# Patient Record
Sex: Female | Born: 1990 | Race: Black or African American | Hispanic: No | Marital: Single | Smoking: Never smoker
Health system: Southern US, Community
[De-identification: ages and names within clinical notes are randomized; demographics above are authoritative.]

## PROBLEM LIST (undated history)

## (undated) DIAGNOSIS — A64 Unspecified sexually transmitted disease: Secondary | ICD-10-CM

## (undated) HISTORY — PX: DILATION AND CURETTAGE OF UTERUS: SHX78

## (undated) HISTORY — DX: Unspecified sexually transmitted disease: A64

---

## 2016-09-01 NOTE — L&D Delivery Note (Addendum)
Operative Delivery Note At 2:02 AM a viable and healthy female was delivered via , Forcep Assisted.  Presentation: vertex; Position: Occiput,, Posterior; Station: +3.  Verbal consent: obtained from patient.  Risks and benefits discussed in detail.  Risks include, but are not limited to the risks of anesthesia, bleeding, infection, damage to maternal tissues, fetal cephalhematoma.  There is also the risk of inability to effect vaginal delivery of the head, or shoulder dystocia that cannot be resolved by established maneuvers, leading to the need for emergency cesarean section.  APGAR: 5, 6; weight  .   Placenta status: , .   Cord:  with the following complications: .  Cord pH: 7.17, PCO2 in record.  Anesthesia:  epidural Instruments: Mityvac II bell. Episiotomy: None Lacerations:  Deep second degree withsulcus tearing bilateral Suture Repair: 2.0 vicryl rapide and 3-0 monocryl Est. Blood Loss (mL):  450  Mom to postpartum.  Baby to Couplet care / Skin to Skin. Pt given informed consent discussion, and agreed with recommended vacuum assist. Catheter removed from bladder . Assistance requested from del team. Peds notified.  With the first contraction, the bell popped off after 1 push due to poor seal. POsition of the bell had been confirmed as posterior to the anterior fontanel which was identified. On the second push the baby came down nicely, and spontaneously rotated clockwise to ROA and bell was removed. With the next contraction, third since start of vacuum effort, the pt delivered the head , the shoulders could be rotated such that the right arm was anterior and released easily, and baby expelled by Mom over a second degree laceration, Baby passed to peds at 30 secs with fhr in 80-100 range. Significant moulding of the vertex noted. Cord blood gas obtained and  Routine labs. No malodor noted. Placenta expelled by mom in one push. ebl 450 Repair of anterior sphincter capsule with a single  figure of 8. Of 2-0 vicryl rapide. Perineal body stitches x 2 also Remainder of repair with continuous running 3-0 monocryl. Good repair accomplished  Tilda BurrowJohn V Jarman Litton 07/26/2017, 3:08 AM

## 2016-12-30 ENCOUNTER — Ambulatory Visit (INDEPENDENT_AMBULATORY_CARE_PROVIDER_SITE_OTHER): Payer: Federal, State, Local not specified - PPO | Admitting: *Deleted

## 2016-12-30 DIAGNOSIS — N912 Amenorrhea, unspecified: Secondary | ICD-10-CM

## 2016-12-30 DIAGNOSIS — Z3201 Encounter for pregnancy test, result positive: Secondary | ICD-10-CM | POA: Diagnosis not present

## 2016-12-30 DIAGNOSIS — Z3400 Encounter for supervision of normal first pregnancy, unspecified trimester: Secondary | ICD-10-CM

## 2016-12-30 LAB — POCT URINE PREGNANCY: Preg Test, Ur: POSITIVE — AB

## 2016-12-30 MED ORDER — PRENATAL PLUS DHA 7-0.4-100 MG PO TABS: 1.0000 | ORAL_TABLET | Freq: Every day | ORAL | 11 refills | Status: DC

## 2016-12-30 NOTE — Progress Notes (Signed)
Patient had positive home test. LMP 10/25/2016 [redacted]w[redacted]d EDD 08/01/17 Patient to make NOB appointment.

## 2017-01-16 ENCOUNTER — Encounter: Payer: Federal, State, Local not specified - PPO | Admitting: Obstetrics

## 2017-02-05 ENCOUNTER — Ambulatory Visit (INDEPENDENT_AMBULATORY_CARE_PROVIDER_SITE_OTHER): Payer: Federal, State, Local not specified - PPO | Admitting: Obstetrics

## 2017-02-05 ENCOUNTER — Encounter: Payer: Self-pay | Admitting: Obstetrics

## 2017-02-05 VITALS — BP 118/67 | HR 81 | Ht 64.0 in | Wt 163.5 lb

## 2017-02-05 DIAGNOSIS — N898 Other specified noninflammatory disorders of vagina: Secondary | ICD-10-CM

## 2017-02-05 DIAGNOSIS — Z113 Encounter for screening for infections with a predominantly sexual mode of transmission: Secondary | ICD-10-CM | POA: Diagnosis not present

## 2017-02-05 DIAGNOSIS — Z3687 Encounter for antenatal screening for uncertain dates: Secondary | ICD-10-CM

## 2017-02-05 DIAGNOSIS — Z124 Encounter for screening for malignant neoplasm of cervix: Secondary | ICD-10-CM

## 2017-02-05 DIAGNOSIS — Z3490 Encounter for supervision of normal pregnancy, unspecified, unspecified trimester: Secondary | ICD-10-CM

## 2017-02-05 DIAGNOSIS — Z3492 Encounter for supervision of normal pregnancy, unspecified, second trimester: Secondary | ICD-10-CM

## 2017-02-05 NOTE — Progress Notes (Signed)
Subjective:    Wanda Gillespie is being seen today for her first obstetrical visit.  This is not a planned pregnancy. She is at 2889w5d gestation. Her obstetrical history is significant for none. Relationship with FOB: significant other, living together. Patient does intend to breast feed. Pregnancy history fully reviewed.  The information documented in the HPI was reviewed and verified.  Menstrual History: OB History    Gravida Para Term Preterm AB Living   1             SAB TAB Ectopic Multiple Live Births                   Patient's last menstrual period was 10/25/2016.    History reviewed. No pertinent past medical history.  Past Surgical History:  Procedure Laterality Date  . DILATION AND CURETTAGE OF UTERUS       (Not in a hospital admission) Not on File  Social History  Substance Use Topics  . Smoking status: Never Smoker  . Smokeless tobacco: Never Used  . Alcohol use No    Family History  Problem Relation Age of Onset  . Heart disease Maternal Grandmother   . Heart disease Paternal Grandmother      Review of Systems Constitutional: negative for weight loss Gastrointestinal: negative for vomiting Genitourinary:negative for genital lesions and vaginal discharge and dysuria Musculoskeletal:negative for back pain Behavioral/Psych: negative for abusive relationship, depression, illegal drug usage and tobacco use    Objective:    BP 118/67   Pulse 81   Ht 5\' 4"  (1.626 m)   Wt 163 lb 8 oz (74.2 kg)   LMP 10/25/2016   BMI 28.06 kg/m  General Appearance:    Alert, cooperative, no distress, appears stated age  Head:    Normocephalic, without obvious abnormality, atraumatic  Eyes:    PERRL, conjunctiva/corneas clear, EOM's intact, fundi    benign, both eyes  Ears:    Normal TM's and external ear canals, both ears  Nose:   Nares normal, septum midline, mucosa normal, no drainage    or sinus tenderness  Throat:   Lips, mucosa, and tongue normal; teeth and gums  normal  Neck:   Supple, symmetrical, trachea midline, no adenopathy;    thyroid:  no enlargement/tenderness/nodules; no carotid   bruit or JVD  Back:     Symmetric, no curvature, ROM normal, no CVA tenderness  Lungs:     Clear to auscultation bilaterally, respirations unlabored  Chest Wall:    No tenderness or deformity   Heart:    Regular rate and rhythm, S1 and S2 normal, no murmur, rub   or gallop  Breast Exam:    No tenderness, masses, or nipple abnormality  Abdomen:     Soft, non-tender, bowel sounds active all four quadrants,    no masses, no organomegaly  Genitalia:    Normal female without lesion, discharge or tenderness  Extremities:   Extremities normal, atraumatic, no cyanosis or edema  Pulses:   2+ and symmetric all extremities  Skin:   Skin color, texture, turgor normal, no rashes or lesions  Lymph nodes:   Cervical, supraclavicular, and axillary nodes normal  Neurologic:   CNII-XII intact, normal strength, sensation and reflexes    throughout      Lab Review Urine pregnancy test Labs reviewed yes Radiologic studies reviewed no  Assessment:    Pregnancy at 1189w5d weeks    Plan:     1. Encounter for supervision of normal pregnancy, antepartum,  unspecified gravidity Rx: - Culture, OB Urine - Cystic Fibrosis Mutation 97 - Hemoglobinopathy evaluation - Obstetric Panel, Including HIV - Varicella zoster antibody, IgG - VITAMIN D 25 Hydroxy (Vit-D Deficiency, Fractures) - Cytology - PAP - [place] MFM OB COMP + 14 WK; Future  2. Vaginal discharge Rx: - Cervicovaginal ancillary only  3. Unsure of LMP (last menstrual period) as reason for ultrasound scan - Ultrasound ordered for dating  Prenatal vitamins.  Counseling provided regarding continued use of seat belts, cessation of alcohol consumption, smoking or use of illicit drugs; infection precautions i.e., influenza/TDAP immunizations, toxoplasmosis,CMV, parvovirus, listeria and varicella; workplace safety, exercise  during pregnancy; routine dental care, safe medications, sexual activity, hot tubs, saunas, pools, travel, caffeine use, fish and methlymercury, potential toxins, hair treatments, varicose veins Weight gain recommendations per IOM guidelines reviewed: underweight/BMI< 18.5--> gain 28 - 40 lbs; normal weight/BMI 18.5 - 24.9--> gain 25 - 35 lbs; overweight/BMI 25 - 29.9--> gain 15 - 25 lbs; obese/BMI >30->gain  11 - 20 lbs Problem list reviewed and updated. FIRST/CF mutation testing/NIPT/QUAD SCREEN/fragile X/Ashkenazi Jewish population testing/Spinal muscular atrophy discussed: requested. Role of ultrasound in pregnancy discussed; fetal survey: requested. Amniocentesis discussed: not indicated.  No orders of the defined types were placed in this encounter.  Orders Placed This Encounter  Procedures  . Culture, OB Urine  . [place] MFM OB COMP + 14 WK    Standing Status:   Future    Standing Expiration Date:   04/07/2018    Order Specific Question:   Reason for Exam (SYMPTOM  OR DIAGNOSIS REQUIRED)    Answer:   Unsure LMP    Order Specific Question:   Preferred imaging location?    Answer:   [facility]  . Cystic Fibrosis Mutation 97  . Hemoglobinopathy evaluation  . Obstetric Panel, Including HIV  . Varicella zoster antibody, IgG  . VITAMIN D 25 Hydroxy (Vit-D Deficiency, Fractures)    Follow up in 2 weeks. 50% of 20 min visit spent on counseling and coordination of care. Patient ID: Wanda Gillespie, female   DOB: 08-Nov-1990, 26 y.o.   MRN: [id number]

## 2017-02-05 NOTE — Addendum Note (Signed)
Addended by: Hamilton CapriBURCH, Molley Houser J on: 02/05/2017 04:03 PM   Modules accepted: Orders

## 2017-02-06 LAB — CERVICOVAGINAL ANCILLARY ONLY
BACTERIAL VAGINITIS: NEGATIVE
CHLAMYDIA, DNA PROBE: NEGATIVE
Candida vaginitis: POSITIVE — AB
Neisseria Gonorrhea: NEGATIVE
Trichomonas: NEGATIVE

## 2017-02-07 ENCOUNTER — Other Ambulatory Visit: Payer: Self-pay | Admitting: Obstetrics

## 2017-02-07 DIAGNOSIS — B3731 Acute candidiasis of vulva and vagina: Secondary | ICD-10-CM

## 2017-02-07 DIAGNOSIS — B373 Candidiasis of vulva and vagina: Secondary | ICD-10-CM

## 2017-02-07 MED ORDER — TERCONAZOLE 0.4 % VA CREA: 1.0000 | TOPICAL_CREAM | Freq: Every day | VAGINAL | 2 refills | Status: DC

## 2017-02-09 LAB — OBSTETRIC PANEL, INCLUDING HIV
Antibody Screen: NEGATIVE
BASOS ABS: 0 10*3/uL (ref 0.0–0.2)
Basos: 0 %
EOS (ABSOLUTE): 0.1 10*3/uL (ref 0.0–0.4)
Eos: 1 %
HEMATOCRIT: 34.7 % (ref 34.0–46.6)
HEP B S AG: NEGATIVE
HIV Screen 4th Generation wRfx: NONREACTIVE
Hemoglobin: 12 g/dL (ref 11.1–15.9)
IMMATURE GRANS (ABS): 0 10*3/uL (ref 0.0–0.1)
Immature Granulocytes: 0 %
LYMPHS: 25 %
Lymphocytes Absolute: 1.7 10*3/uL (ref 0.7–3.1)
MCH: 29.3 pg (ref 26.6–33.0)
MCHC: 34.6 g/dL (ref 31.5–35.7)
MCV: 85 fL (ref 79–97)
MONOCYTES: 7 %
Monocytes Absolute: 0.5 10*3/uL (ref 0.1–0.9)
Neutrophils Absolute: 4.5 10*3/uL (ref 1.4–7.0)
Neutrophils: 67 %
PLATELETS: 163 10*3/uL (ref 150–379)
RBC: 4.1 x10E6/uL (ref 3.77–5.28)
RDW: 13.8 % (ref 12.3–15.4)
RPR: NONREACTIVE
RUBELLA: 1.48 {index} (ref >0.99)
Rh Factor: POSITIVE
WBC: 6.8 10*3/uL (ref 3.4–10.8)

## 2017-02-09 LAB — HEMOGLOBINOPATHY EVALUATION
HGB C: 0 %
HGB S: 0 %
HGB VARIANT: 0 %
Hemoglobin A2 Quantitation: 2.4 % (ref 1.8–3.2)
Hemoglobin F Quantitation: 0 % (ref 0.0–2.0)
Hgb A: 97.6 % (ref 96.4–98.8)

## 2017-02-09 LAB — VARICELLA ZOSTER ANTIBODY, IGG

## 2017-02-09 LAB — VITAMIN D 25 HYDROXY (VIT D DEFICIENCY, FRACTURES): VIT D 25 HYDROXY: 26.3 ng/mL — AB (ref 30.0–100.0)

## 2017-02-10 NOTE — Addendum Note (Signed)
Addended by: Reva BoresPRATT, TANYA S on: 02/10/2017 09:48 AM   Modules accepted: Kipp BroodSmartSet

## 2017-02-11 LAB — URINE CULTURE, OB REFLEX

## 2017-02-11 LAB — CULTURE, OB URINE

## 2017-02-12 ENCOUNTER — Other Ambulatory Visit: Payer: Self-pay | Admitting: Obstetrics

## 2017-02-12 DIAGNOSIS — O09899 Supervision of other high risk pregnancies, unspecified trimester: Secondary | ICD-10-CM

## 2017-02-12 DIAGNOSIS — Z87440 Personal history of urinary (tract) infections: Principal | ICD-10-CM

## 2017-02-12 MED ORDER — AMOXICILLIN-POT CLAVULANATE 875-125 MG PO TABS: 1.0000 | ORAL_TABLET | Freq: Two times a day (BID) | ORAL | 0 refills | Status: DC

## 2017-02-13 ENCOUNTER — Other Ambulatory Visit: Payer: Self-pay | Admitting: Obstetrics

## 2017-02-13 LAB — CYTOLOGY - PAP: Diagnosis: NEGATIVE

## 2017-03-09 ENCOUNTER — Ambulatory Visit (HOSPITAL_COMMUNITY)
Admission: RE | Admit: 2017-03-09 | Discharge: 2017-03-09 | Disposition: A | Discharge status: Home / Self Care | Payer: Federal, State, Local not specified - PPO | Source: Ambulatory Visit | Attending: Obstetrics | Admitting: Obstetrics

## 2017-03-09 ENCOUNTER — Other Ambulatory Visit: Payer: Self-pay | Admitting: Obstetrics

## 2017-03-09 DIAGNOSIS — Z3490 Encounter for supervision of normal pregnancy, unspecified, unspecified trimester: Secondary | ICD-10-CM

## 2017-03-09 DIAGNOSIS — Z3A20 20 weeks gestation of pregnancy: Secondary | ICD-10-CM

## 2017-03-10 ENCOUNTER — Ambulatory Visit (INDEPENDENT_AMBULATORY_CARE_PROVIDER_SITE_OTHER): Payer: Federal, State, Local not specified - PPO | Admitting: Obstetrics & Gynecology

## 2017-03-10 VITALS — BP 114/71 | HR 78 | Wt 171.4 lb

## 2017-03-10 DIAGNOSIS — Z3402 Encounter for supervision of normal first pregnancy, second trimester: Secondary | ICD-10-CM

## 2017-03-10 DIAGNOSIS — IMO0002 Reserved for concepts with insufficient information to code with codable children: Secondary | ICD-10-CM

## 2017-03-10 DIAGNOSIS — Z0489 Encounter for examination and observation for other specified reasons: Secondary | ICD-10-CM

## 2017-03-10 DIAGNOSIS — Z3400 Encounter for supervision of normal first pregnancy, unspecified trimester: Secondary | ICD-10-CM

## 2017-03-10 NOTE — Progress Notes (Signed)
PRENATAL VISIT NOTE  Subjective:  Wanda Gillespie is a 26 y.o. G1P0 at [redacted]w[redacted]d being seen today for ongoing prenatal care.  She is currently monitored for the following issues for this low-risk pregnancy and has Encounter for supervision of normal pregnancy, unspecified, unspecified trimester on her problem list.  Patient reports no complaints.  Contractions: Not present. Vag. Bleeding: None.  Movement: Present. Denies leaking of fluid.   The following portions of the patient's history were reviewed and updated as appropriate: allergies, current medications, past family history, past medical history, past social history, past surgical history and problem list. Problem list updated.  Objective:   Vitals:   03/10/17 1059  BP: 114/71  Pulse: 78  Weight: 171 lb 6.4 oz (77.7 kg)    Fetal Status: Fetal Heart Rate (bpm): 145   Movement: Present     General:  Alert, oriented and cooperative. Patient is in no acute distress.  Skin: Skin is warm and dry. No rash noted.   Cardiovascular: Normal heart rate noted  Respiratory: Normal respiratory effort, no problems with respiration noted  Abdomen: Soft, gravid, appropriate for gestational age. Pain/Pressure: Absent     Pelvic:  Cervical exam deferred        Extremities: Normal range of motion.  Edema: None  Mental Status: Normal mood and affect. Normal behavior. Normal judgment and thought content.  [place] Mfm Ob Comp + 14 Wk  Result Date: 03/09/2017 ----------------------------------------------------------------------  OBSTETRICS REPORT                      (Signed Final 03/09/2017 05:43 pm) ---------------------------------------------------------------------- Patient Info  ID #:       [id number]                         D.O.B.:   1990/12/02 (25 yrs)  Name:       Wanda Gillespie                  Visit Date:  03/09/2017 12:54 pm ---------------------------------------------------------------------- Performed By  Performed By:     Emeline Darling BS,      Ref.  Address:     [address]                                                             [place], [place]                                                             [id number]  Attending:        Particia Nearing MD  [facility]  Referred By:      Brock Bad MD ---------------------------------------------------------------------- Orders   #  Description                                 Code   1  [place] MFM OB COMP + 14 WK                      76805.01  ----------------------------------------------------------------------   #  Ordered By               Order #        Accession #    Episode #   1  Coral Ceo           [id number]      [id number]     [id number]  ---------------------------------------------------------------------- Indications   [redacted] weeks gestation of pregnancy                Z3A.20   Encounter for antenatal screening for          Z36.3   malformations   Encounter for uncertain dates                  Z36.87  ---------------------------------------------------------------------- OB History  Blood Type:            Height:  5'4"   Weight (lb):  163      BMI:   27.98  Gravidity:    1 ---------------------------------------------------------------------- Fetal Evaluation  Num Of Fetuses:     1  Fetal Heart         151  Rate(bpm):  Cardiac Activity:   Observed  Presentation:       Cephalic  Placenta:           Anterior, above cervical os  P. Cord Insertion:  Visualized  Amniotic Fluid  AFI FV:      Subjectively within normal limits                              Largest Pocket(cm)                              4.6 ---------------------------------------------------------------------- Biometry  BPD:        47  mm     G. Age:  20w 1d         40  %    CI:        73.34   %   70 - 86                                                          FL/HC:      20.2   %   16.8 - 19.8  HC:       174.4  mm     G. Age:  20w 0d         23  %    HC/AC:      1.17  1.09 - 1.39  AC:      148.5  mm     G. Age:  20w 1d         34  %    FL/BPD:     74.9   %  FL:       35.2  mm     G. Age:  21w 1d         66  %    FL/AC:      23.7   %   20 - 24  NFT:       3.3  mm  Est. FW:     358  gm    0 lb 13 oz      49  % ---------------------------------------------------------------------- Gestational Age  LMP:           19w 2d       Date:   10/25/16                 EDD:   08/01/17  U/S Today:     20w 3d                                        EDD:   07/24/17  Best:          Cherylann Parr 3d    Det. By:   U/S (03/09/17)           EDD:   07/24/17 ---------------------------------------------------------------------- Anatomy  Cranium:               Appears normal         Aortic Arch:            Appears normal  Cavum:                 Appears normal         Ductal Arch:            Appears normal  Ventricles:            Appears normal         Diaphragm:              Appears normal  Choroid Plexus:        Appears normal         Stomach:                Appears normal, left                                                                        sided  Cerebellum:            Appears normal         Abdomen:                Appears normal  Posterior Fossa:       Appears normal         Abdominal Wall:         Appears nml (cord  insert, abd wall)  Nuchal Fold:           Appears normal         Cord Vessels:           Appears normal (3                                                                        vessel cord)  Face:                  Appears normal         Kidneys:                Appear normal                         (orbits and profile)  Lips:                  Appears normal         Bladder:                Appears normal  Thoracic:              Appears normal         Spine:                  Not well visualized  Heart:                 Appears normal         Upper  Extremities:      Appears normal                         (4CH, axis, and situs  RVOT:                  Appears normal         Lower Extremities:      Appears normal  LVOT:                  Appears normal  Other:  Female gender. Heels and 5th digit visualized. Technically difficult          due to fetal position. ---------------------------------------------------------------------- Cervix Uterus Adnexa  Cervix  Length:            3.4  cm.  Normal appearance by transabdominal scan.  Adnexa:       No abnormality visualized. ---------------------------------------------------------------------- Impression  SIUP at 20+3 weeks  Normal detailed fetal anatomy; limited views of spine  Markers of aneuploidy: none  Normal amniotic fluid volume  EDC based on today's measurements: 07/24/17 ---------------------------------------------------------------------- Recommendations  Follow-up ultrasound in 4-6 weeks to complete anatomy  survey ----------------------------------------------------------------------                 Particia Nearing, MD Electronically Signed Final Report   03/09/2017 05:43 pm ----------------------------------------------------------------------   Assessment and Plan:  Pregnancy: G1P0 at [redacted]w[redacted]d  1. Evaluate anatomy not seen on prior sonogram Normal anatomy scan except limited spine.  Rescan ordered and scheduled.  - [place] MFM OB FOLLOW UP; Future  2. Supervision of normal first pregnancy, antepartum Desires quad screen. - AFP TETRA Preterm labor  symptoms and general obstetric precautions including but not limited to vaginal bleeding, contractions, leaking of fluid and fetal movement were reviewed in detail with the patient. Please refer to After Visit Summary for other counseling recommendations.  Return in about 4 weeks (around 04/07/2017) for OB Visit.   Jaynie CollinsUgonna Harshika Mago, MD

## 2017-03-10 NOTE — Patient Instructions (Signed)
Return to clinic for any scheduled appointments or obstetric concerns, or go to [facility] for evaluation    Second Trimester of Pregnancy The second trimester is from week 14 through week 27 (months 4 through 6). The second trimester is often a time when you feel your best. Your body has adjusted to being pregnant, and you begin to feel better physically. Usually, morning sickness has lessened or quit completely, you may have more energy, and you may have an increase in appetite. The second trimester is also a time when the fetus is growing rapidly. At the end of the sixth month, the fetus is about 9 inches long and weighs about 1 pounds. You will likely begin to feel the baby move (quickening) between 16 and 20 weeks of pregnancy. Body changes during your second trimester Your body continues to go through many changes during your second trimester. The changes vary from woman to woman.  Your weight will continue to increase. You will notice your lower abdomen bulging out.  You may begin to get stretch marks on your hips, abdomen, and breasts.  You may develop headaches that can be relieved by medicines. The medicines should be approved by your health care provider.  You may urinate more often because the fetus is pressing on your bladder.  You may develop or continue to have heartburn as a result of your pregnancy.  You may develop constipation because certain hormones are causing the muscles that push waste through your intestines to slow down.  You may develop hemorrhoids or swollen, bulging veins (varicose veins).  You may have back pain. This is caused by: ? Weight gain. ? Pregnancy hormones that are relaxing the joints in your pelvis. ? A shift in weight and the muscles that support your balance.  Your breasts will continue to grow and they will continue to become tender.  Your gums may bleed and may be sensitive to brushing and flossing.  Dark spots or blotches (chloasma, mask of  pregnancy) may develop on your face. This will likely fade after the baby is born.  A dark line from your belly button to the pubic area (linea nigra) may appear. This will likely fade after the baby is born.  You may have changes in your hair. These can include thickening of your hair, rapid growth, and changes in texture. Some women also have hair loss during or after pregnancy, or hair that feels dry or thin. Your hair will most likely return to normal after your baby is born.  What to expect at prenatal visits During a routine prenatal visit:  You will be weighed to make sure you and the fetus are growing normally.  Your blood pressure will be taken.  Your abdomen will be measured to track your baby's growth.  The fetal heartbeat will be listened to.  Any test results from the previous visit will be discussed.  Your health care provider may ask you:  How you are feeling.  If you are feeling the baby move.  If you have had any abnormal symptoms, such as leaking fluid, bleeding, severe headaches, or abdominal cramping.  If you are using any tobacco products, including cigarettes, chewing tobacco, and electronic cigarettes.  If you have any questions.  Other tests that may be performed during your second trimester include:  Blood tests that check for: ? Low iron levels (anemia). ? High blood sugar that affects pregnant women (gestational diabetes) between 24 and 28 weeks. ? Rh antibodies. This is to   check for a protein on red blood cells (Rh factor).  Urine tests to check for infections, diabetes, or protein in the urine.  An ultrasound to confirm the proper growth and development of the baby.  An amniocentesis to check for possible genetic problems.  Fetal screens for spina bifida and Down syndrome.  HIV (human immunodeficiency virus) testing. Routine prenatal testing includes screening for HIV, unless you choose not to have this test.  Follow these instructions at  home: Medicines  Follow your health care provider's instructions regarding medicine use. Specific medicines may be either safe or unsafe to take during pregnancy.  Take a prenatal vitamin that contains at least 600 micrograms (mcg) of folic acid.  If you develop constipation, try taking a stool softener if your health care provider approves. Eating and drinking  Eat a balanced diet that includes fresh fruits and vegetables, whole grains, good sources of protein such as meat, eggs, or tofu, and low-fat dairy. Your health care provider will help you determine the amount of weight gain that is right for you.  Avoid raw meat and uncooked cheese. These carry germs that can cause birth defects in the baby.  If you have low calcium intake from food, talk to your health care provider about whether you should take a daily calcium supplement.  Limit foods that are high in fat and processed sugars, such as fried and sweet foods.  To prevent constipation: ? Drink enough fluid to keep your urine clear or pale yellow. ? Eat foods that are high in fiber, such as fresh fruits and vegetables, whole grains, and beans. Activity  Exercise only as directed by your health care provider. Most women can continue their usual exercise routine during pregnancy. Try to exercise for 30 minutes at least 5 days a week. Stop exercising if you experience uterine contractions.  Avoid heavy lifting, wear low heel shoes, and practice good posture.  A sexual relationship may be continued unless your health care provider directs you otherwise. Relieving pain and discomfort  Wear a good support bra to prevent discomfort from breast tenderness.  Take warm sitz baths to soothe any pain or discomfort caused by hemorrhoids. Use hemorrhoid cream if your health care provider approves.  Rest with your legs elevated if you have leg cramps or low back pain.  If you develop varicose veins, wear support hose. Elevate your feet  for 15 minutes, 3-4 times a day. Limit salt in your diet. Prenatal Care  Write down your questions. Take them to your prenatal visits.  Keep all your prenatal visits as told by your health care provider. This is important. Safety  Wear your seat belt at all times when driving.  Make a list of emergency phone numbers, including numbers for family, friends, the hospital, and police and fire departments. General instructions  Ask your health care provider for a referral to a local prenatal education class. Begin classes no later than the beginning of month 6 of your pregnancy.  Ask for help if you have counseling or nutritional needs during pregnancy. Your health care provider can offer advice or refer you to specialists for help with various needs.  Do not use hot tubs, steam rooms, or saunas.  Do not douche or use tampons or scented sanitary pads.  Do not cross your legs for long periods of time.  Avoid cat litter boxes and soil used by cats. These carry germs that can cause birth defects in the baby and possibly loss of the   fetus by miscarriage or stillbirth.  Avoid all smoking, herbs, alcohol, and unprescribed drugs. Chemicals in these products can affect the formation and growth of the baby.  Do not use any products that contain nicotine or tobacco, such as cigarettes and e-cigarettes. If you need help quitting, ask your health care provider.  Visit your dentist if you have not gone yet during your pregnancy. Use a soft toothbrush to brush your teeth and be gentle when you floss. Contact a health care provider if:  You have dizziness.  You have mild pelvic cramps, pelvic pressure, or nagging pain in the abdominal area.  You have persistent nausea, vomiting, or diarrhea.  You have a bad smelling vaginal discharge.  You have pain when you urinate. Get help right away if:  You have a fever.  You are leaking fluid from your vagina.  You have spotting or bleeding from your  vagina.  You have severe abdominal cramping or pain.  You have rapid weight gain or weight loss.  You have shortness of breath with chest pain.  You notice sudden or extreme swelling of your face, hands, ankles, feet, or legs.  You have not felt your baby move in over an hour.  You have severe headaches that do not go away when you take medicine.  You have vision changes. Summary  The second trimester is from week 14 through week 27 (months 4 through 6). It is also a time when the fetus is growing rapidly.  Your body goes through many changes during pregnancy. The changes vary from woman to woman.  Avoid all smoking, herbs, alcohol, and unprescribed drugs. These chemicals affect the formation and growth your baby.  Do not use any tobacco products, such as cigarettes, chewing tobacco, and e-cigarettes. If you need help quitting, ask your health care provider.  Contact your health care provider if you have any questions. Keep all prenatal visits as told by your health care provider. This is important. This information is not intended to replace advice given to you by your health care provider. Make sure you discuss any questions you have with your health care provider. Document Released: 08/12/2001 Document Revised: 01/24/2016 Document Reviewed: 10/19/2012 [employer] Interactive Patient Education  2017 [employer]  

## 2017-03-14 LAB — AFP TETRA
DIA MOM VALUE: 0.6
DIA VALUE (EIA): 115.79 pg/mL
DSR (By Age)    1 IN: 1074
DSR (Second Trimester) 1 IN: 10000
GESTATIONAL AGE AFP: 20.6 wk
MATERNAL AGE AT EDD: 23.9 a
MSAFP Mom: 1.16
MSAFP: 71.3 ng/mL
MSHCG Mom: 1.08
MSHCG: 22932 m[IU]/mL
OSB RISK: 10000
T18 (By Age): 1:4185 {titer}
Test Results:: NEGATIVE
UE3 MOM: 1.03
UE3 VALUE: 1.95 ng/mL
Weight: 171 [lb_av]

## 2017-04-07 ENCOUNTER — Ambulatory Visit (INDEPENDENT_AMBULATORY_CARE_PROVIDER_SITE_OTHER): Payer: Federal, State, Local not specified - PPO | Admitting: Obstetrics and Gynecology

## 2017-04-07 ENCOUNTER — Other Ambulatory Visit: Payer: Self-pay | Admitting: Obstetrics & Gynecology

## 2017-04-07 ENCOUNTER — Ambulatory Visit (HOSPITAL_COMMUNITY)
Admission: RE | Admit: 2017-04-07 | Discharge: 2017-04-07 | Disposition: A | Discharge status: Home / Self Care | Payer: Federal, State, Local not specified - PPO | Source: Ambulatory Visit | Attending: Obstetrics & Gynecology | Admitting: Obstetrics & Gynecology

## 2017-04-07 VITALS — BP 113/66 | HR 84 | Wt 185.5 lb

## 2017-04-07 DIAGNOSIS — IMO0002 Reserved for concepts with insufficient information to code with codable children: Secondary | ICD-10-CM

## 2017-04-07 DIAGNOSIS — Z3A24 24 weeks gestation of pregnancy: Secondary | ICD-10-CM

## 2017-04-07 DIAGNOSIS — Z362 Encounter for other antenatal screening follow-up: Secondary | ICD-10-CM

## 2017-04-07 DIAGNOSIS — Z0489 Encounter for examination and observation for other specified reasons: Secondary | ICD-10-CM

## 2017-04-07 DIAGNOSIS — Z3402 Encounter for supervision of normal first pregnancy, second trimester: Secondary | ICD-10-CM

## 2017-04-07 DIAGNOSIS — R8271 Bacteriuria: Secondary | ICD-10-CM | POA: Insufficient documentation

## 2017-04-07 DIAGNOSIS — Z3400 Encounter for supervision of normal first pregnancy, unspecified trimester: Secondary | ICD-10-CM

## 2017-04-07 NOTE — Progress Notes (Signed)
   PRENATAL VISIT NOTE  Subjective:  Wanda Gillespie is a 26 y.o. G1P0 at 314w4d being seen today for ongoing prenatal care.  She is currently monitored for the following issues for this low-risk pregnancy and has Encounter for supervision of normal pregnancy, unspecified, unspecified trimester and GBS bacteriuria on her problem list.  Patient reports no complaints.  Contractions: Not present. Vag. Bleeding: None.  Movement: Present. Denies leaking of fluid.   The following portions of the patient's history were reviewed and updated as appropriate: allergies, current medications, past family history, past medical history, past social history, past surgical history and problem list. Problem list updated.  Objective:   Vitals:   04/07/17 0942  BP: 113/66  Pulse: 84  Weight: 185 lb 8 oz (84.1 kg)    Fetal Status: Fetal Heart Rate (bpm): 145 Fundal Height: 24 cm Movement: Present     General:  Alert, oriented and cooperative. Patient is in no acute distress.  Skin: Skin is warm and dry. No rash noted.   Cardiovascular: Normal heart rate noted  Respiratory: Normal respiratory effort, no problems with respiration noted  Abdomen: Soft, gravid, appropriate for gestational age.  Pain/Pressure: Absent     Pelvic: Cervical exam deferred        Extremities: Normal range of motion.  Edema: None  Mental Status:  Normal mood and affect. Normal behavior. Normal judgment and thought content.   Assessment and Plan:  Pregnancy: G1P0 at 5714w4d  1. Supervision of normal first pregnancy, antepartum Patient is doing well without complaints Third trimester labs, glucola and tdap at next visit Patient remains undecided on contraception and pediatrician Follow up ultrasound report earlier today not available for review with the patient  2. GBS bacteriuria Will provide prophylaxis in labor  Preterm labor symptoms and general obstetric precautions including but not limited to vaginal bleeding,  contractions, leaking of fluid and fetal movement were reviewed in detail with the patient. Please refer to After Visit Summary for other counseling recommendations.  Return in about 4 weeks (around 05/05/2017) for ROB, 2 hr glucola next visit.   Catalina AntiguaPeggy Vestal Markin, MD

## 2017-04-07 NOTE — Progress Notes (Signed)
Pt complains of having nose bleeds every night.

## 2017-04-07 NOTE — Patient Instructions (Addendum)
Contraception Choices Contraception (birth control) is the use of any methods or devices to prevent pregnancy. Below are some methods to help avoid pregnancy. Hormonal methods  Contraceptive implant. This is a thin, plastic tube containing progesterone hormone. It does not contain estrogen hormone. Your health care provider inserts the tube in the inner part of the upper arm. The tube can remain in place for up to 3 years. After 3 years, the implant must be removed. The implant prevents the ovaries from releasing an egg (ovulation), thickens the cervical mucus to prevent sperm from entering the uterus, and thins the lining of the inside of the uterus.  Progesterone-only injections. These injections are given every 3 months by your health care provider to prevent pregnancy. This synthetic progesterone hormone stops the ovaries from releasing eggs. It also thickens cervical mucus and changes the uterine lining. This makes it harder for sperm to survive in the uterus.  Birth control pills. These pills contain estrogen and progesterone hormone. They work by preventing the ovaries from releasing eggs (ovulation). They also cause the cervical mucus to thicken, preventing the sperm from entering the uterus. Birth control pills are prescribed by a health care provider.Birth control pills can also be used to treat heavy periods.  Minipill. This type of birth control pill contains only the progesterone hormone. They are taken every day of each month and must be prescribed by your health care provider.  Birth control patch. The patch contains hormones similar to those in birth control pills. It must be changed once a week and is prescribed by a health care provider.  Vaginal ring. The ring contains hormones similar to those in birth control pills. It is left in the vagina for 3 weeks, removed for 1 week, and then a new one is put back in place. The patient must be comfortable inserting and removing the ring from  the vagina.A health care provider's prescription is necessary.  Emergency contraception. Emergency contraceptives prevent pregnancy after unprotected sexual intercourse. This pill can be taken right after sex or up to 5 days after unprotected sex. It is most effective the sooner you take the pills after having sexual intercourse. Most emergency contraceptive pills are available without a prescription. Check with your pharmacist. Do not use emergency contraception as your only form of birth control. Barrier methods  Female condom. This is a thin sheath (latex or rubber) that is worn over the penis during sexual intercourse. It can be used with spermicide to increase effectiveness.  Female condom. This is a soft, loose-fitting sheath that is put into the vagina before sexual intercourse.  Diaphragm. This is a soft, latex, dome-shaped barrier that must be fitted by a health care provider. It is inserted into the vagina, along with a spermicidal jelly. It is inserted before intercourse. The diaphragm should be left in the vagina for 6 to 8 hours after intercourse.  Cervical cap. This is a round, soft, latex or plastic cup that fits over the cervix and must be fitted by a health care provider. The cap can be left in place for up to 48 hours after intercourse.  Sponge. This is a soft, circular piece of polyurethane foam. The sponge has spermicide in it. It is inserted into the vagina after wetting it and before sexual intercourse.  Spermicides. These are chemicals that kill or block sperm from entering the cervix and uterus. They come in the form of creams, jellies, suppositories, foam, or tablets. They do not require a prescription. They   are inserted into the vagina with an applicator before having sexual intercourse. The process must be repeated every time you have sexual intercourse. Intrauterine contraception  Intrauterine device (IUD). This is a T-shaped device that is put in a woman's uterus during  a menstrual period to prevent pregnancy. There are 2 types: ? Copper IUD. This type of IUD is wrapped in copper wire and is placed inside the uterus. Copper makes the uterus and fallopian tubes produce a fluid that kills sperm. It can stay in place for 10 years. ? Hormone IUD. This type of IUD contains the hormone progestin (synthetic progesterone). The hormone thickens the cervical mucus and prevents sperm from entering the uterus, and it also thins the uterine lining to prevent implantation of a fertilized egg. The hormone can weaken or kill the sperm that get into the uterus. It can stay in place for 3-5 years, depending on which type of IUD is used. Permanent methods of contraception  Female tubal ligation. This is when the woman's fallopian tubes are surgically sealed, tied, or blocked to prevent the egg from traveling to the uterus.  Hysteroscopic sterilization. This involves placing a small coil or insert into each fallopian tube. Your doctor uses a technique called hysteroscopy to do the procedure. The device causes scar tissue to form. This results in permanent blockage of the fallopian tubes, so the sperm cannot fertilize the egg. It takes about 3 months after the procedure for the tubes to become blocked. You must use another form of birth control for these 3 months.  Female sterilization. This is when the female has the tubes that carry sperm tied off (vasectomy).This blocks sperm from entering the vagina during sexual intercourse. After the procedure, the man can still ejaculate fluid (semen). Natural planning methods  Natural family planning. This is not having sexual intercourse or using a barrier method (condom, diaphragm, cervical cap) on days the woman could become pregnant.  Calendar method. This is keeping track of the length of each menstrual cycle and identifying when you are fertile.  Ovulation method. This is avoiding sexual intercourse during ovulation.  Symptothermal method.  This is avoiding sexual intercourse during ovulation, using a thermometer and ovulation symptoms.  Post-ovulation method. This is timing sexual intercourse after you have ovulated. Regardless of which type or method of contraception you choose, it is important that you use condoms to protect against the transmission of sexually transmitted infections (STIs). Talk with your health care provider about which form of contraception is most appropriate for you. This information is not intended to replace advice given to you by your health care provider. Make sure you discuss any questions you have with your health care provider. Document Released: 08/18/2005 Document Revised: 01/24/2016 Document Reviewed: 02/10/2013 Elsevier Interactive Patient Education  2017 [employer]   Tdap Vaccine (Tetanus, Diphtheria and Pertussis): What You Need to Know 1. Why get vaccinated? Tetanus, diphtheria and pertussis are very serious diseases. Tdap vaccine can protect us from these diseases. And, Tdap vaccine given to pregnant women can protect newborn babies against pertussis. TETANUS (Lockjaw) is rare in the [place] today. It causes painful muscle tightening and stiffness, usually all over the body.  It can lead to tightening of muscles in the head and neck so you can't open your mouth, swallow, or sometimes even breathe. Tetanus kills about 1 out of 10 people who are infected even after receiving the best medical care.  DIPHTHERIA is also rare in the [place] today. It can   cause a thick coating to form in the back of the throat.  It can lead to breathing problems, heart failure, paralysis, and death.  PERTUSSIS (Whooping Cough) causes severe coughing spells, which can cause difficulty breathing, vomiting and disturbed sleep.  It can also lead to weight loss, incontinence, and rib fractures. Up to 2 in 100 adolescents and 5 in 100 adults with pertussis are hospitalized or have complications, which  could include pneumonia or death.  These diseases are caused by bacteria. Diphtheria and pertussis are spread from person to person through secretions from coughing or sneezing. Tetanus enters the body through cuts, scratches, or wounds. Before vaccines, as many as 200,000 cases of diphtheria, 200,000 cases of pertussis, and hundreds of cases of tetanus, were reported in the [place] each year. Since vaccination began, reports of cases for tetanus and diphtheria have dropped by about 99% and for pertussis by about 80%. 2. Tdap vaccine Tdap vaccine can protect adolescents and adults from tetanus, diphtheria, and pertussis. One dose of Tdap is routinely given at age 11 or 12. People who did not get Tdap at that age should get it as soon as possible. Tdap is especially important for healthcare professionals and anyone having close contact with a baby younger than 12 months. Pregnant women should get a dose of Tdap during every pregnancy, to protect the newborn from pertussis. Infants are most at risk for severe, life-threatening complications from pertussis. Another vaccine, called Td, protects against tetanus and diphtheria, but not pertussis. A Td booster should be given every 10 years. Tdap may be given as one of these boosters if you have never gotten Tdap before. Tdap may also be given after a severe cut or burn to prevent tetanus infection. Your doctor or the person giving you the vaccine can give you more information. Tdap may safely be given at the same time as other vaccines. 3. Some people should not get this vaccine  A person who has ever had a life-threatening allergic reaction after a previous dose of any diphtheria, tetanus or pertussis containing vaccine, OR has a severe allergy to any part of this vaccine, should not get Tdap vaccine. Tell the person giving the vaccine about any severe allergies.  Anyone who had coma or long repeated seizures within 7 days after a childhood dose of  DTP or DTaP, or a previous dose of Tdap, should not get Tdap, unless a cause other than the vaccine was found. They can still get Td.  Talk to your doctor if you: ? have seizures or another nervous system problem, ? had severe pain or swelling after any vaccine containing diphtheria, tetanus or pertussis, ? ever had a condition called Guillain-Barr Syndrome (GBS), ? aren't feeling well on the day the shot is scheduled. 4. Risks With any medicine, including vaccines, there is a chance of side effects. These are usually mild and go away on their own. Serious reactions are also possible but are rare. Most people who get Tdap vaccine do not have any problems with it. Mild problems following Tdap: (Did not interfere with activities)  Pain where the shot was given (about 3 in 4 adolescents or 2 in 3 adults)  Redness or swelling where the shot was given (about 1 person in 5)  Mild fever of at least 100.4F (up to about 1 in 25 adolescents or 1 in 100 adults)  Headache (about 3 or 4 people in 10)  Tiredness (about 1 person in 3 or 4)  Nausea,   vomiting, diarrhea, stomach ache (up to 1 in 4 adolescents or 1 in 10 adults)  Chills, sore joints (about 1 person in 10)  Body aches (about 1 person in 3 or 4)  Rash, swollen glands (uncommon)  Moderate problems following Tdap: (Interfered with activities, but did not require medical attention)  Pain where the shot was given (up to 1 in 5 or 6)  Redness or swelling where the shot was given (up to about 1 in 16 adolescents or 1 in 12 adults)  Fever over 102F (about 1 in 100 adolescents or 1 in 250 adults)  Headache (about 1 in 7 adolescents or 1 in 10 adults)  Nausea, vomiting, diarrhea, stomach ache (up to 1 or 3 people in 100)  Swelling of the entire arm where the shot was given (up to about 1 in 500).  Severe problems following Tdap: (Unable to perform usual activities; required medical attention)  Swelling, severe pain, bleeding  and redness in the arm where the shot was given (rare).  Problems that could happen after any vaccine:  People sometimes faint after a medical procedure, including vaccination. Sitting or lying down for about 15 minutes can help prevent fainting, and injuries caused by a fall. Tell your doctor if you feel dizzy, or have vision changes or ringing in the ears.  Some people get severe pain in the shoulder and have difficulty moving the arm where a shot was given. This happens very rarely.  Any medication can cause a severe allergic reaction. Such reactions from a vaccine are very rare, estimated at fewer than 1 in a million doses, and would happen within a few minutes to a few hours after the vaccination. As with any medicine, there is a very remote chance of a vaccine causing a serious injury or death. The safety of vaccines is always being monitored. For more information, visit: www.cdc.gov/vaccinesafety/ 5. What if there is a serious problem? What should I look for? Look for anything that concerns you, such as signs of a severe allergic reaction, very high fever, or unusual behavior. Signs of a severe allergic reaction can include hives, swelling of the face and throat, difficulty breathing, a fast heartbeat, dizziness, and weakness. These would usually start a few minutes to a few hours after the vaccination. What should I do?  If you think it is a severe allergic reaction or other emergency that can't wait, call 9-1-1 or get the person to the nearest hospital. Otherwise, call your doctor.  Afterward, the reaction should be reported to the Vaccine Adverse Event Reporting System (VAERS). Your doctor might file this report, or you can do it yourself through the VAERS web site at www.vaers.hhs.gov, or by calling 1-800-822-7967. ? VAERS does not give medical advice. 6. The National Vaccine Injury Compensation Program The National Vaccine Injury Compensation Program (VICP) is a federal program that  was created to compensate people who may have been injured by certain vaccines. Persons who believe they may have been injured by a vaccine can learn about the program and about filing a claim by calling 1-800-338-2382 or visiting the VICP website at www.hrsa.gov/vaccinecompensation. There is a time limit to file a claim for compensation. 7. How can I learn more?  Ask your doctor. He or she can give you the vaccine package insert or suggest other sources of information.  Call your local or state health department.  Contact the Centers for Disease Control and Prevention (CDC): ? Call 1-800-232-4636 (1-800-CDC-INFO) or ? Visit CDC's website   at www.cdc.gov/vaccines CDC Tdap Vaccine VIS (10/25/13) This information is not intended to replace advice given to you by your health care provider. Make sure you discuss any questions you have with your health care provider. Document Released: 02/17/2012 Document Revised: 05/08/2016 Document Reviewed: 05/08/2016 Elsevier Interactive Patient Education  2017 [employer] Inc.   

## 2017-05-05 ENCOUNTER — Other Ambulatory Visit: Payer: Federal, State, Local not specified - PPO

## 2017-05-05 ENCOUNTER — Ambulatory Visit (INDEPENDENT_AMBULATORY_CARE_PROVIDER_SITE_OTHER): Payer: Federal, State, Local not specified - PPO | Admitting: Obstetrics & Gynecology

## 2017-05-05 VITALS — BP 113/74 | HR 86 | Wt 193.0 lb

## 2017-05-05 DIAGNOSIS — Z3403 Encounter for supervision of normal first pregnancy, third trimester: Secondary | ICD-10-CM

## 2017-05-05 DIAGNOSIS — Z23 Encounter for immunization: Secondary | ICD-10-CM | POA: Diagnosis not present

## 2017-05-05 DIAGNOSIS — O2603 Excessive weight gain in pregnancy, third trimester: Secondary | ICD-10-CM | POA: Insufficient documentation

## 2017-05-05 DIAGNOSIS — Z3400 Encounter for supervision of normal first pregnancy, unspecified trimester: Secondary | ICD-10-CM

## 2017-05-05 NOTE — Progress Notes (Signed)
   PRENATAL VISIT NOTE  Subjective:  Wanda Gillespie is a 26 y.o. G1P0 at 6764w4d being seen today for ongoing prenatal care.  She is currently monitored for the following issues for this low-risk pregnancy and has Encounter for supervision of normal pregnancy, unspecified, unspecified trimester; GBS bacteriuria; and Excessive weight gain during pregnancy, antepartum, third trimester on her problem list.  Patient reports no complaints.  Contractions: Not present. Vag. Bleeding: None.  Movement: Present. Denies leaking of fluid.   The following portions of the patient's history were reviewed and updated as appropriate: allergies, current medications, past family history, past medical history, past social history, past surgical history and problem list. Problem list updated.  Objective:   Vitals:   05/05/17 0853  BP: 113/74  Pulse: 86  Weight: 193 lb (87.5 kg)    Fetal Status: Fetal Heart Rate (bpm): 140 Fundal Height: 29 cm Movement: Present     General:  Alert, oriented and cooperative. Patient is in no acute distress.  Skin: Skin is warm and dry. No rash noted.   Cardiovascular: Normal heart rate noted  Respiratory: Normal respiratory effort, no problems with respiration noted  Abdomen: Soft, gravid, appropriate for gestational age.  Pain/Pressure: Absent     Pelvic: Cervical exam deferred        Extremities: Normal range of motion.  Edema: None  Mental Status:  Normal mood and affect. Normal behavior. Normal judgment and thought content.   Assessment and Plan:  Pregnancy: G1P0 at 3464w4d  1. Supervision of normal first pregnancy, antepartum Normal 28 week labs - Glucose Tolerance, 2 Hours w/1 Hour - CBC - HIV antibody - RPR  2. Excessive weight gain during pregnancy, antepartum, third trimester Counseled proper diet and healthy weight gain  Preterm labor symptoms and general obstetric precautions including but not limited to vaginal bleeding, contractions, leaking of fluid and  fetal movement were reviewed in detail with the patient. Please refer to After Visit Summary for other counseling recommendations.  Return in about 2 weeks (around 05/19/2017).   Scheryl DarterJames Arnold, MD

## 2017-05-05 NOTE — Patient Instructions (Signed)
Third Trimester of Pregnancy The third trimester is from week 28 through week 40 (months 7 through 9). The third trimester is a time when the unborn baby (fetus) is growing rapidly. At the end of the ninth month, the fetus is about 20 inches in length and weighs 6-10 pounds. Body changes during your third trimester Your body will continue to go through many changes during pregnancy. The changes vary from woman to woman. During the third trimester:  Your weight will continue to increase. You can expect to gain 25-35 pounds (11-16 kg) by the end of the pregnancy.  You may begin to get stretch marks on your hips, abdomen, and breasts.  You may urinate more often because the fetus is moving lower into your pelvis and pressing on your bladder.  You may develop or continue to have heartburn. This is caused by increased hormones that slow down muscles in the digestive tract.  You may develop or continue to have constipation because increased hormones slow digestion and cause the muscles that push waste through your intestines to relax.  You may develop hemorrhoids. These are swollen veins (varicose veins) in the rectum that can itch or be painful.  You may develop swollen, bulging veins (varicose veins) in your legs.  You may have increased body aches in the pelvis, back, or thighs. This is due to weight gain and increased hormones that are relaxing your joints.  You may have changes in your hair. These can include thickening of your hair, rapid growth, and changes in texture. Some women also have hair loss during or after pregnancy, or hair that feels dry or thin. Your hair will most likely return to normal after your baby is born.  Your breasts will continue to grow and they will continue to become tender. A yellow fluid (colostrum) may leak from your breasts. This is the first milk you are producing for your baby.  Your belly button may stick out.  You may notice more swelling in your hands,  face, or ankles.  You may have increased tingling or numbness in your hands, arms, and legs. The skin on your belly may also feel numb.  You may feel short of breath because of your expanding uterus.  You may have more problems sleeping. This can be caused by the size of your belly, increased need to urinate, and an increase in your body's metabolism.  You may notice the fetus "dropping," or moving lower in your abdomen (lightening).  You may have increased vaginal discharge.  You may notice your joints feel loose and you may have pain around your pelvic bone.  What to expect at prenatal visits You will have prenatal exams every 2 weeks until week 36. Then you will have weekly prenatal exams. During a routine prenatal visit:  You will be weighed to make sure you and the baby are growing normally.  Your blood pressure will be taken.  Your abdomen will be measured to track your baby's growth.  The fetal heartbeat will be listened to.  Any test results from the previous visit will be discussed.  You may have a cervical check near your due date to see if your cervix has softened or thinned (effaced).  You will be tested for Group B streptococcus. This happens between 35 and 37 weeks.  Your health care provider may ask you:  What your birth plan is.  How you are feeling.  If you are feeling the baby move.  If you have had   any abnormal symptoms, such as leaking fluid, bleeding, severe headaches, or abdominal cramping.  If you are using any tobacco products, including cigarettes, chewing tobacco, and electronic cigarettes.  If you have any questions.  Other tests or screenings that may be performed during your third trimester include:  Blood tests that check for low iron levels (anemia).  Fetal testing to check the health, activity level, and growth of the fetus. Testing is done if you have certain medical conditions or if there are problems during the  pregnancy.  Nonstress test (NST). This test checks the health of your baby to make sure there are no signs of problems, such as the baby not getting enough oxygen. During this test, a belt is placed around your belly. The baby is made to move, and its heart rate is monitored during movement.  What is false labor? False labor is a condition in which you feel small, irregular tightenings of the muscles in the womb (contractions) that usually go away with rest, changing position, or drinking water. These are called Braxton Hicks contractions. Contractions may last for hours, days, or even weeks before true labor sets in. If contractions come at regular intervals, become more frequent, increase in intensity, or become painful, you should see your health care provider. What are the signs of labor?  Abdominal cramps.  Regular contractions that start at 10 minutes apart and become stronger and more frequent with time.  Contractions that start on the top of the uterus and spread down to the lower abdomen and back.  Increased pelvic pressure and dull back pain.  A watery or bloody mucus discharge that comes from the vagina.  Leaking of amniotic fluid. This is also known as your "water breaking." It could be a slow trickle or a gush. Let your health care provider know if it has a color or strange odor. If you have any of these signs, call your health care provider right away, even if it is before your due date. Follow these instructions at home: Medicines  Follow your health care provider's instructions regarding medicine use. Specific medicines may be either safe or unsafe to take during pregnancy.  Take a prenatal vitamin that contains at least 600 micrograms (mcg) of folic acid.  If you develop constipation, try taking a stool softener if your health care provider approves. Eating and drinking  Eat a balanced diet that includes fresh fruits and vegetables, whole grains, good sources of protein  such as meat, eggs, or tofu, and low-fat dairy. Your health care provider will help you determine the amount of weight gain that is right for you.  Avoid raw meat and uncooked cheese. These carry germs that can cause birth defects in the baby.  If you have low calcium intake from food, talk to your health care provider about whether you should take a daily calcium supplement.  Eat four or five small meals rather than three large meals a day.  Limit foods that are high in fat and processed sugars, such as fried and sweet foods.  To prevent constipation: ? Drink enough fluid to keep your urine clear or pale yellow. ? Eat foods that are high in fiber, such as fresh fruits and vegetables, whole grains, and beans. Activity  Exercise only as directed by your health care provider. Most women can continue their usual exercise routine during pregnancy. Try to exercise for 30 minutes at least 5 days a week. Stop exercising if you experience uterine contractions.  Avoid heavy   lifting.  Do not exercise in extreme heat or humidity, or at high altitudes.  Wear low-heel, comfortable shoes.  Practice good posture.  You may continue to have sex unless your health care provider tells you otherwise. Relieving pain and discomfort  Take frequent breaks and rest with your legs elevated if you have leg cramps or low back pain.  Take warm sitz baths to soothe any pain or discomfort caused by hemorrhoids. Use hemorrhoid cream if your health care provider approves.  Wear a good support bra to prevent discomfort from breast tenderness.  If you develop varicose veins: ? Wear support pantyhose or compression stockings as told by your healthcare provider. ? Elevate your feet for 15 minutes, 3-4 times a day. Prenatal care  Write down your questions. Take them to your prenatal visits.  Keep all your prenatal visits as told by your health care provider. This is important. Safety  Wear your seat belt at  all times when driving.  Make a list of emergency phone numbers, including numbers for family, friends, the hospital, and police and fire departments. General instructions  Avoid cat litter boxes and soil used by cats. These carry germs that can cause birth defects in the baby. If you have a cat, ask someone to clean the litter box for you.  Do not travel far distances unless it is absolutely necessary and only with the approval of your health care provider.  Do not use hot tubs, steam rooms, or saunas.  Do not drink alcohol.  Do not use any products that contain nicotine or tobacco, such as cigarettes and e-cigarettes. If you need help quitting, ask your health care provider.  Do not use any medicinal herbs or unprescribed drugs. These chemicals affect the formation and growth of the baby.  Do not douche or use tampons or scented sanitary pads.  Do not cross your legs for long periods of time.  To prepare for the arrival of your baby: ? Take prenatal classes to understand, practice, and ask questions about labor and delivery. ? Make a trial run to the hospital. ? Visit the hospital and tour the maternity area. ? Arrange for maternity or paternity leave through employers. ? Arrange for family and friends to take care of pets while you are in the hospital. ? Purchase a rear-facing car seat and make sure you know how to install it in your car. ? Pack your hospital bag. ? Prepare the baby's nursery. Make sure to remove all pillows and stuffed animals from the baby's crib to prevent suffocation.  Visit your dentist if you have not gone during your pregnancy. Use a soft toothbrush to brush your teeth and be gentle when you floss. Contact a health care provider if:  You are unsure if you are in labor or if your water has broken.  You become dizzy.  You have mild pelvic cramps, pelvic pressure, or nagging pain in your abdominal area.  You have lower back pain.  You have persistent  nausea, vomiting, or diarrhea.  You have an unusual or bad smelling vaginal discharge.  You have pain when you urinate. Get help right away if:  Your water breaks before 37 weeks.  You have regular contractions less than 5 minutes apart before 37 weeks.  You have a fever.  You are leaking fluid from your vagina.  You have spotting or bleeding from your vagina.  You have severe abdominal pain or cramping.  You have rapid weight loss or weight gain.    You have shortness of breath with chest pain.  You notice sudden or extreme swelling of your face, hands, ankles, feet, or legs.  Your baby makes fewer than 10 movements in 2 hours.  You have severe headaches that do not go away when you take medicine.  You have vision changes. Summary  The third trimester is from week 28 through week 40, months 7 through 9. The third trimester is a time when the unborn baby (fetus) is growing rapidly.  During the third trimester, your discomfort may increase as you and your baby continue to gain weight. You may have abdominal, leg, and back pain, sleeping problems, and an increased need to urinate.  During the third trimester your breasts will keep growing and they will continue to become tender. A yellow fluid (colostrum) may leak from your breasts. This is the first milk you are producing for your baby.  False labor is a condition in which you feel small, irregular tightenings of the muscles in the womb (contractions) that eventually go away. These are called Braxton Hicks contractions. Contractions may last for hours, days, or even weeks before true labor sets in.  Signs of labor can include: abdominal cramps; regular contractions that start at 10 minutes apart and become stronger and more frequent with time; watery or bloody mucus discharge that comes from the vagina; increased pelvic pressure and dull back pain; and leaking of amniotic fluid. This information is not intended to replace advice  given to you by your health care provider. Make sure you discuss any questions you have with your health care provider. Document Released: 08/12/2001 Document Revised: 01/24/2016 Document Reviewed: 10/19/2012 [employer] Interactive Patient Education  2017 [employer]  

## 2017-05-06 LAB — CBC
Hematocrit: 33.2 % — ABNORMAL LOW (ref 34.0–46.6)
Hemoglobin: 11.5 g/dL (ref 11.1–15.9)
MCH: 29.8 pg (ref 26.6–33.0)
MCHC: 34.6 g/dL (ref 31.5–35.7)
MCV: 86 fL (ref 79–97)
PLATELETS: 158 10*3/uL (ref 150–379)
RBC: 3.86 x10E6/uL (ref 3.77–5.28)
RDW: 13.5 % (ref 12.3–15.4)
WBC: 7.8 10*3/uL (ref 3.4–10.8)

## 2017-05-06 LAB — GLUCOSE TOLERANCE, 2 HOURS W/ 1HR
Glucose, 1 hour: 159 mg/dL (ref 65–179)
Glucose, 2 hour: 114 mg/dL (ref 65–152)
Glucose, Fasting: 76 mg/dL (ref 65–91)

## 2017-05-06 LAB — RPR: RPR Ser Ql: NONREACTIVE

## 2017-05-06 LAB — HIV ANTIBODY (ROUTINE TESTING W REFLEX): HIV Screen 4th Generation wRfx: NONREACTIVE

## 2017-05-19 ENCOUNTER — Encounter: Payer: Federal, State, Local not specified - PPO | Admitting: Obstetrics & Gynecology

## 2017-05-19 ENCOUNTER — Ambulatory Visit (INDEPENDENT_AMBULATORY_CARE_PROVIDER_SITE_OTHER): Payer: Federal, State, Local not specified - PPO | Admitting: Obstetrics & Gynecology

## 2017-05-19 VITALS — BP 105/66 | HR 91 | Wt 194.0 lb

## 2017-05-19 DIAGNOSIS — Z3400 Encounter for supervision of normal first pregnancy, unspecified trimester: Secondary | ICD-10-CM

## 2017-05-19 NOTE — Patient Instructions (Signed)
Third Trimester of Pregnancy The third trimester is from week 28 through week 40 (months 7 through 9). The third trimester is a time when the unborn baby (fetus) is growing rapidly. At the end of the ninth month, the fetus is about 20 inches in length and weighs 6-10 pounds. Body changes during your third trimester Your body will continue to go through many changes during pregnancy. The changes vary from woman to woman. During the third trimester:  Your weight will continue to increase. You can expect to gain 25-35 pounds (11-16 kg) by the end of the pregnancy.  You may begin to get stretch marks on your hips, abdomen, and breasts.  You may urinate more often because the fetus is moving lower into your pelvis and pressing on your bladder.  You may develop or continue to have heartburn. This is caused by increased hormones that slow down muscles in the digestive tract.  You may develop or continue to have constipation because increased hormones slow digestion and cause the muscles that push waste through your intestines to relax.  You may develop hemorrhoids. These are swollen veins (varicose veins) in the rectum that can itch or be painful.  You may develop swollen, bulging veins (varicose veins) in your legs.  You may have increased body aches in the pelvis, back, or thighs. This is due to weight gain and increased hormones that are relaxing your joints.  You may have changes in your hair. These can include thickening of your hair, rapid growth, and changes in texture. Some women also have hair loss during or after pregnancy, or hair that feels dry or thin. Your hair will most likely return to normal after your baby is born.  Your breasts will continue to grow and they will continue to become tender. A yellow fluid (colostrum) may leak from your breasts. This is the first milk you are producing for your baby.  Your belly button may stick out.  You may notice more swelling in your hands,  face, or ankles.  You may have increased tingling or numbness in your hands, arms, and legs. The skin on your belly may also feel numb.  You may feel short of breath because of your expanding uterus.  You may have more problems sleeping. This can be caused by the size of your belly, increased need to urinate, and an increase in your body's metabolism.  You may notice the fetus "dropping," or moving lower in your abdomen (lightening).  You may have increased vaginal discharge.  You may notice your joints feel loose and you may have pain around your pelvic bone.  What to expect at prenatal visits You will have prenatal exams every 2 weeks until week 36. Then you will have weekly prenatal exams. During a routine prenatal visit:  You will be weighed to make sure you and the baby are growing normally.  Your blood pressure will be taken.  Your abdomen will be measured to track your baby's growth.  The fetal heartbeat will be listened to.  Any test results from the previous visit will be discussed.  You may have a cervical check near your due date to see if your cervix has softened or thinned (effaced).  You will be tested for Group B streptococcus. This happens between 35 and 37 weeks.  Your health care provider may ask you:  What your birth plan is.  How you are feeling.  If you are feeling the baby move.  If you have had   any abnormal symptoms, such as leaking fluid, bleeding, severe headaches, or abdominal cramping.  If you are using any tobacco products, including cigarettes, chewing tobacco, and electronic cigarettes.  If you have any questions.  Other tests or screenings that may be performed during your third trimester include:  Blood tests that check for low iron levels (anemia).  Fetal testing to check the health, activity level, and growth of the fetus. Testing is done if you have certain medical conditions or if there are problems during the  pregnancy.  Nonstress test (NST). This test checks the health of your baby to make sure there are no signs of problems, such as the baby not getting enough oxygen. During this test, a belt is placed around your belly. The baby is made to move, and its heart rate is monitored during movement.  What is false labor? False labor is a condition in which you feel small, irregular tightenings of the muscles in the womb (contractions) that usually go away with rest, changing position, or drinking water. These are called Braxton Hicks contractions. Contractions may last for hours, days, or even weeks before true labor sets in. If contractions come at regular intervals, become more frequent, increase in intensity, or become painful, you should see your health care provider. What are the signs of labor?  Abdominal cramps.  Regular contractions that start at 10 minutes apart and become stronger and more frequent with time.  Contractions that start on the top of the uterus and spread down to the lower abdomen and back.  Increased pelvic pressure and dull back pain.  A watery or bloody mucus discharge that comes from the vagina.  Leaking of amniotic fluid. This is also known as your "water breaking." It could be a slow trickle or a gush. Let your health care provider know if it has a color or strange odor. If you have any of these signs, call your health care provider right away, even if it is before your due date. Follow these instructions at home: Medicines  Follow your health care provider's instructions regarding medicine use. Specific medicines may be either safe or unsafe to take during pregnancy.  Take a prenatal vitamin that contains at least 600 micrograms (mcg) of folic acid.  If you develop constipation, try taking a stool softener if your health care provider approves. Eating and drinking  Eat a balanced diet that includes fresh fruits and vegetables, whole grains, good sources of protein  such as meat, eggs, or tofu, and low-fat dairy. Your health care provider will help you determine the amount of weight gain that is right for you.  Avoid raw meat and uncooked cheese. These carry germs that can cause birth defects in the baby.  If you have low calcium intake from food, talk to your health care provider about whether you should take a daily calcium supplement.  Eat four or five small meals rather than three large meals a day.  Limit foods that are high in fat and processed sugars, such as fried and sweet foods.  To prevent constipation: ? Drink enough fluid to keep your urine clear or pale yellow. ? Eat foods that are high in fiber, such as fresh fruits and vegetables, whole grains, and beans. Activity  Exercise only as directed by your health care provider. Most women can continue their usual exercise routine during pregnancy. Try to exercise for 30 minutes at least 5 days a week. Stop exercising if you experience uterine contractions.  Avoid heavy   lifting.  Do not exercise in extreme heat or humidity, or at high altitudes.  Wear low-heel, comfortable shoes.  Practice good posture.  You may continue to have sex unless your health care provider tells you otherwise. Relieving pain and discomfort  Take frequent breaks and rest with your legs elevated if you have leg cramps or low back pain.  Take warm sitz baths to soothe any pain or discomfort caused by hemorrhoids. Use hemorrhoid cream if your health care provider approves.  Wear a good support bra to prevent discomfort from breast tenderness.  If you develop varicose veins: ? Wear support pantyhose or compression stockings as told by your healthcare provider. ? Elevate your feet for 15 minutes, 3-4 times a day. Prenatal care  Write down your questions. Take them to your prenatal visits.  Keep all your prenatal visits as told by your health care provider. This is important. Safety  Wear your seat belt at  all times when driving.  Make a list of emergency phone numbers, including numbers for family, friends, the hospital, and police and fire departments. General instructions  Avoid cat litter boxes and soil used by cats. These carry germs that can cause birth defects in the baby. If you have a cat, ask someone to clean the litter box for you.  Do not travel far distances unless it is absolutely necessary and only with the approval of your health care provider.  Do not use hot tubs, steam rooms, or saunas.  Do not drink alcohol.  Do not use any products that contain nicotine or tobacco, such as cigarettes and e-cigarettes. If you need help quitting, ask your health care provider.  Do not use any medicinal herbs or unprescribed drugs. These chemicals affect the formation and growth of the baby.  Do not douche or use tampons or scented sanitary pads.  Do not cross your legs for long periods of time.  To prepare for the arrival of your baby: ? Take prenatal classes to understand, practice, and ask questions about labor and delivery. ? Make a trial run to the hospital. ? Visit the hospital and tour the maternity area. ? Arrange for maternity or paternity leave through employers. ? Arrange for family and friends to take care of pets while you are in the hospital. ? Purchase a rear-facing car seat and make sure you know how to install it in your car. ? Pack your hospital bag. ? Prepare the baby's nursery. Make sure to remove all pillows and stuffed animals from the baby's crib to prevent suffocation.  Visit your dentist if you have not gone during your pregnancy. Use a soft toothbrush to brush your teeth and be gentle when you floss. Contact a health care provider if:  You are unsure if you are in labor or if your water has broken.  You become dizzy.  You have mild pelvic cramps, pelvic pressure, or nagging pain in your abdominal area.  You have lower back pain.  You have persistent  nausea, vomiting, or diarrhea.  You have an unusual or bad smelling vaginal discharge.  You have pain when you urinate. Get help right away if:  Your water breaks before 37 weeks.  You have regular contractions less than 5 minutes apart before 37 weeks.  You have a fever.  You are leaking fluid from your vagina.  You have spotting or bleeding from your vagina.  You have severe abdominal pain or cramping.  You have rapid weight loss or weight gain.    You have shortness of breath with chest pain.  You notice sudden or extreme swelling of your face, hands, ankles, feet, or legs.  Your baby makes fewer than 10 movements in 2 hours.  You have severe headaches that do not go away when you take medicine.  You have vision changes. Summary  The third trimester is from week 28 through week 40, months 7 through 9. The third trimester is a time when the unborn baby (fetus) is growing rapidly.  During the third trimester, your discomfort may increase as you and your baby continue to gain weight. You may have abdominal, leg, and back pain, sleeping problems, and an increased need to urinate.  During the third trimester your breasts will keep growing and they will continue to become tender. A yellow fluid (colostrum) may leak from your breasts. This is the first milk you are producing for your baby.  False labor is a condition in which you feel small, irregular tightenings of the muscles in the womb (contractions) that eventually go away. These are called Braxton Hicks contractions. Contractions may last for hours, days, or even weeks before true labor sets in.  Signs of labor can include: abdominal cramps; regular contractions that start at 10 minutes apart and become stronger and more frequent with time; watery or bloody mucus discharge that comes from the vagina; increased pelvic pressure and dull back pain; and leaking of amniotic fluid. This information is not intended to replace advice  given to you by your health care provider. Make sure you discuss any questions you have with your health care provider. Document Released: 08/12/2001 Document Revised: 01/24/2016 Document Reviewed: 10/19/2012 [employer] Interactive Patient Education  2017 [employer]  

## 2017-05-19 NOTE — Progress Notes (Signed)
   PRENATAL VISIT NOTE  Subjective:  Wanda Gillespie is a 26 y.o. G1P0 at [redacted]w[redacted]d being seen today for ongoing prenatal care.  She is currently monitored for the following issues for this low-risk pregnancy and has Encounter for supervision of normal pregnancy, unspecified, unspecified trimester; GBS bacteriuria; and Excessive weight gain during pregnancy, antepartum, third trimester on her problem list.  Patient reports no complaints.  Contractions: Not present. Vag. Bleeding: None.  Movement: Present. Denies leaking of fluid.   The following portions of the patient's history were reviewed and updated as appropriate: allergies, current medications, past family history, past medical history, past social history, past surgical history and problem list. Problem list updated.  Objective:   Vitals:   05/19/17 1540  BP: 105/66  Pulse: 91  Weight: 194 lb (88 kg)    Fetal Status: Fetal Heart Rate (bpm): 155 Fundal Height: 31 cm Movement: Present     General:  Alert, oriented and cooperative. Patient is in no acute distress.  Skin: Skin is warm and dry. No rash noted.   Cardiovascular: Normal heart rate noted  Respiratory: Normal respiratory effort, no problems with respiration noted  Abdomen: Soft, gravid, appropriate for gestational age.  Pain/Pressure: Absent     Pelvic: Cervical exam deferred        Extremities: Normal range of motion.  Edema: Trace  Mental Status:  Normal mood and affect. Normal behavior. Normal judgment and thought content.   Assessment and Plan:  Pregnancy: G1P0 at [redacted]w[redacted]d  1. Supervision of normal first pregnancy, antepartum   Preterm labor symptoms and general obstetric precautions including but not limited to vaginal bleeding, contractions, leaking of fluid and fetal movement were reviewed in detail with the patient. Please refer to After Visit Summary for other counseling recommendations.  Return in about 2 weeks (around 06/02/2017).   Scheryl Darter, MD

## 2017-06-02 ENCOUNTER — Ambulatory Visit (INDEPENDENT_AMBULATORY_CARE_PROVIDER_SITE_OTHER): Payer: Federal, State, Local not specified - PPO | Admitting: Obstetrics & Gynecology

## 2017-06-02 DIAGNOSIS — Z3400 Encounter for supervision of normal first pregnancy, unspecified trimester: Secondary | ICD-10-CM

## 2017-06-02 NOTE — Progress Notes (Signed)
   PRENATAL VISIT NOTE  Subjective:  Wanda Gillespie is a 26 y.o. G1P0 at [redacted]w[redacted]d being seen today for ongoing prenatal care.  She is currently monitored for the following issues for this low-risk pregnancy and has Encounter for supervision of normal pregnancy, unspecified, unspecified trimester; GBS bacteriuria; and Excessive weight gain during pregnancy, antepartum, third trimester on her problem list.  Patient reports no complaints.  Contractions: Not present. Vag. Bleeding: None.  Movement: Present. Denies leaking of fluid.   The following portions of the patient's history were reviewed and updated as appropriate: allergies, current medications, past family history, past medical history, past social history, past surgical history and problem list. Problem list updated.  Objective:   Vitals:   06/02/17 1526  BP: 113/70  Pulse: 86  Weight: 194 lb (88 kg)    Fetal Status: Fetal Heart Rate (bpm): 135 Fundal Height: 33 cm Movement: Present     General:  Alert, oriented and cooperative. Patient is in no acute distress.  Skin: Skin is warm and dry. No rash noted.   Cardiovascular: Normal heart rate noted  Respiratory: Normal respiratory effort, no problems with respiration noted  Abdomen: Soft, gravid, appropriate for gestational age.  Pain/Pressure: Absent     Pelvic: Cervical exam deferred        Extremities: Normal range of motion.  Edema: None  Mental Status:  Normal mood and affect. Normal behavior. Normal judgment and thought content.   Assessment and Plan:  Pregnancy: G1P0 at [redacted]w[redacted]d  1. Supervision of normal first pregnancy, antepartum Doing well  Preterm labor symptoms and general obstetric precautions including but not limited to vaginal bleeding, contractions, leaking of fluid and fetal movement were reviewed in detail with the patient. Please refer to After Visit Summary for other counseling recommendations.  Return in about 2 weeks (around 06/16/2017).   Scheryl Darter,  MD

## 2017-06-02 NOTE — Patient Instructions (Signed)
Third Trimester of Pregnancy The third trimester is from week 28 through week 40 (months 7 through 9). The third trimester is a time when the unborn baby (fetus) is growing rapidly. At the end of the ninth month, the fetus is about 20 inches in length and weighs 6-10 pounds. Body changes during your third trimester Your body will continue to go through many changes during pregnancy. The changes vary from woman to woman. During the third trimester:  Your weight will continue to increase. You can expect to gain 25-35 pounds (11-16 kg) by the end of the pregnancy.  You may begin to get stretch marks on your hips, abdomen, and breasts.  You may urinate more often because the fetus is moving lower into your pelvis and pressing on your bladder.  You may develop or continue to have heartburn. This is caused by increased hormones that slow down muscles in the digestive tract.  You may develop or continue to have constipation because increased hormones slow digestion and cause the muscles that push waste through your intestines to relax.  You may develop hemorrhoids. These are swollen veins (varicose veins) in the rectum that can itch or be painful.  You may develop swollen, bulging veins (varicose veins) in your legs.  You may have increased body aches in the pelvis, back, or thighs. This is due to weight gain and increased hormones that are relaxing your joints.  You may have changes in your hair. These can include thickening of your hair, rapid growth, and changes in texture. Some women also have hair loss during or after pregnancy, or hair that feels dry or thin. Your hair will most likely return to normal after your baby is born.  Your breasts will continue to grow and they will continue to become tender. A yellow fluid (colostrum) may leak from your breasts. This is the first milk you are producing for your baby.  Your belly button may stick out.  You may notice more swelling in your hands,  face, or ankles.  You may have increased tingling or numbness in your hands, arms, and legs. The skin on your belly may also feel numb.  You may feel short of breath because of your expanding uterus.  You may have more problems sleeping. This can be caused by the size of your belly, increased need to urinate, and an increase in your body's metabolism.  You may notice the fetus "dropping," or moving lower in your abdomen (lightening).  You may have increased vaginal discharge.  You may notice your joints feel loose and you may have pain around your pelvic bone.  What to expect at prenatal visits You will have prenatal exams every 2 weeks until week 36. Then you will have weekly prenatal exams. During a routine prenatal visit:  You will be weighed to make sure you and the baby are growing normally.  Your blood pressure will be taken.  Your abdomen will be measured to track your baby's growth.  The fetal heartbeat will be listened to.  Any test results from the previous visit will be discussed.  You may have a cervical check near your due date to see if your cervix has softened or thinned (effaced).  You will be tested for Group B streptococcus. This happens between 35 and 37 weeks.  Your health care provider may ask you:  What your birth plan is.  How you are feeling.  If you are feeling the baby move.  If you have had   any abnormal symptoms, such as leaking fluid, bleeding, severe headaches, or abdominal cramping.  If you are using any tobacco products, including cigarettes, chewing tobacco, and electronic cigarettes.  If you have any questions.  Other tests or screenings that may be performed during your third trimester include:  Blood tests that check for low iron levels (anemia).  Fetal testing to check the health, activity level, and growth of the fetus. Testing is done if you have certain medical conditions or if there are problems during the  pregnancy.  Nonstress test (NST). This test checks the health of your baby to make sure there are no signs of problems, such as the baby not getting enough oxygen. During this test, a belt is placed around your belly. The baby is made to move, and its heart rate is monitored during movement.  What is false labor? False labor is a condition in which you feel small, irregular tightenings of the muscles in the womb (contractions) that usually go away with rest, changing position, or drinking water. These are called Braxton Hicks contractions. Contractions may last for hours, days, or even weeks before true labor sets in. If contractions come at regular intervals, become more frequent, increase in intensity, or become painful, you should see your health care provider. What are the signs of labor?  Abdominal cramps.  Regular contractions that start at 10 minutes apart and become stronger and more frequent with time.  Contractions that start on the top of the uterus and spread down to the lower abdomen and back.  Increased pelvic pressure and dull back pain.  A watery or bloody mucus discharge that comes from the vagina.  Leaking of amniotic fluid. This is also known as your "water breaking." It could be a slow trickle or a gush. Let your health care provider know if it has a color or strange odor. If you have any of these signs, call your health care provider right away, even if it is before your due date. Follow these instructions at home: Medicines  Follow your health care provider's instructions regarding medicine use. Specific medicines may be either safe or unsafe to take during pregnancy.  Take a prenatal vitamin that contains at least 600 micrograms (mcg) of folic acid.  If you develop constipation, try taking a stool softener if your health care provider approves. Eating and drinking  Eat a balanced diet that includes fresh fruits and vegetables, whole grains, good sources of protein  such as meat, eggs, or tofu, and low-fat dairy. Your health care provider will help you determine the amount of weight gain that is right for you.  Avoid raw meat and uncooked cheese. These carry germs that can cause birth defects in the baby.  If you have low calcium intake from food, talk to your health care provider about whether you should take a daily calcium supplement.  Eat four or five small meals rather than three large meals a day.  Limit foods that are high in fat and processed sugars, such as fried and sweet foods.  To prevent constipation: ? Drink enough fluid to keep your urine clear or pale yellow. ? Eat foods that are high in fiber, such as fresh fruits and vegetables, whole grains, and beans. Activity  Exercise only as directed by your health care provider. Most women can continue their usual exercise routine during pregnancy. Try to exercise for 30 minutes at least 5 days a week. Stop exercising if you experience uterine contractions.  Avoid heavy   lifting.  Do not exercise in extreme heat or humidity, or at high altitudes.  Wear low-heel, comfortable shoes.  Practice good posture.  You may continue to have sex unless your health care provider tells you otherwise. Relieving pain and discomfort  Take frequent breaks and rest with your legs elevated if you have leg cramps or low back pain.  Take warm sitz baths to soothe any pain or discomfort caused by hemorrhoids. Use hemorrhoid cream if your health care provider approves.  Wear a good support bra to prevent discomfort from breast tenderness.  If you develop varicose veins: ? Wear support pantyhose or compression stockings as told by your healthcare provider. ? Elevate your feet for 15 minutes, 3-4 times a day. Prenatal care  Write down your questions. Take them to your prenatal visits.  Keep all your prenatal visits as told by your health care provider. This is important. Safety  Wear your seat belt at  all times when driving.  Make a list of emergency phone numbers, including numbers for family, friends, the hospital, and police and fire departments. General instructions  Avoid cat litter boxes and soil used by cats. These carry germs that can cause birth defects in the baby. If you have a cat, ask someone to clean the litter box for you.  Do not travel far distances unless it is absolutely necessary and only with the approval of your health care provider.  Do not use hot tubs, steam rooms, or saunas.  Do not drink alcohol.  Do not use any products that contain nicotine or tobacco, such as cigarettes and e-cigarettes. If you need help quitting, ask your health care provider.  Do not use any medicinal herbs or unprescribed drugs. These chemicals affect the formation and growth of the baby.  Do not douche or use tampons or scented sanitary pads.  Do not cross your legs for long periods of time.  To prepare for the arrival of your baby: ? Take prenatal classes to understand, practice, and ask questions about labor and delivery. ? Make a trial run to the hospital. ? Visit the hospital and tour the maternity area. ? Arrange for maternity or paternity leave through employers. ? Arrange for family and friends to take care of pets while you are in the hospital. ? Purchase a rear-facing car seat and make sure you know how to install it in your car. ? Pack your hospital bag. ? Prepare the baby's nursery. Make sure to remove all pillows and stuffed animals from the baby's crib to prevent suffocation.  Visit your dentist if you have not gone during your pregnancy. Use a soft toothbrush to brush your teeth and be gentle when you floss. Contact a health care provider if:  You are unsure if you are in labor or if your water has broken.  You become dizzy.  You have mild pelvic cramps, pelvic pressure, or nagging pain in your abdominal area.  You have lower back pain.  You have persistent  nausea, vomiting, or diarrhea.  You have an unusual or bad smelling vaginal discharge.  You have pain when you urinate. Get help right away if:  Your water breaks before 37 weeks.  You have regular contractions less than 5 minutes apart before 37 weeks.  You have a fever.  You are leaking fluid from your vagina.  You have spotting or bleeding from your vagina.  You have severe abdominal pain or cramping.  You have rapid weight loss or weight gain.    You have shortness of breath with chest pain.  You notice sudden or extreme swelling of your face, hands, ankles, feet, or legs.  Your baby makes fewer than 10 movements in 2 hours.  You have severe headaches that do not go away when you take medicine.  You have vision changes. Summary  The third trimester is from week 28 through week 40, months 7 through 9. The third trimester is a time when the unborn baby (fetus) is growing rapidly.  During the third trimester, your discomfort may increase as you and your baby continue to gain weight. You may have abdominal, leg, and back pain, sleeping problems, and an increased need to urinate.  During the third trimester your breasts will keep growing and they will continue to become tender. A yellow fluid (colostrum) may leak from your breasts. This is the first milk you are producing for your baby.  False labor is a condition in which you feel small, irregular tightenings of the muscles in the womb (contractions) that eventually go away. These are called Braxton Hicks contractions. Contractions may last for hours, days, or even weeks before true labor sets in.  Signs of labor can include: abdominal cramps; regular contractions that start at 10 minutes apart and become stronger and more frequent with time; watery or bloody mucus discharge that comes from the vagina; increased pelvic pressure and dull back pain; and leaking of amniotic fluid. This information is not intended to replace advice  given to you by your health care provider. Make sure you discuss any questions you have with your health care provider. Document Released: 08/12/2001 Document Revised: 01/24/2016 Document Reviewed: 10/19/2012 [employer] Interactive Patient Education  2017 [employer]  

## 2017-06-16 ENCOUNTER — Ambulatory Visit (INDEPENDENT_AMBULATORY_CARE_PROVIDER_SITE_OTHER): Payer: Federal, State, Local not specified - PPO | Admitting: Obstetrics & Gynecology

## 2017-06-16 VITALS — BP 114/73 | HR 81 | Wt 196.0 lb

## 2017-06-16 DIAGNOSIS — Z3403 Encounter for supervision of normal first pregnancy, third trimester: Secondary | ICD-10-CM

## 2017-06-16 DIAGNOSIS — Z3400 Encounter for supervision of normal first pregnancy, unspecified trimester: Secondary | ICD-10-CM

## 2017-06-16 NOTE — Patient Instructions (Signed)
Third Trimester of Pregnancy The third trimester is from week 28 through week 40 (months 7 through 9). The third trimester is a time when the unborn baby (fetus) is growing rapidly. At the end of the ninth month, the fetus is about 20 inches in length and weighs 6-10 pounds. Body changes during your third trimester Your body will continue to go through many changes during pregnancy. The changes vary from woman to woman. During the third trimester:  Your weight will continue to increase. You can expect to gain 25-35 pounds (11-16 kg) by the end of the pregnancy.  You may begin to get stretch marks on your hips, abdomen, and breasts.  You may urinate more often because the fetus is moving lower into your pelvis and pressing on your bladder.  You may develop or continue to have heartburn. This is caused by increased hormones that slow down muscles in the digestive tract.  You may develop or continue to have constipation because increased hormones slow digestion and cause the muscles that push waste through your intestines to relax.  You may develop hemorrhoids. These are swollen veins (varicose veins) in the rectum that can itch or be painful.  You may develop swollen, bulging veins (varicose veins) in your legs.  You may have increased body aches in the pelvis, back, or thighs. This is due to weight gain and increased hormones that are relaxing your joints.  You may have changes in your hair. These can include thickening of your hair, rapid growth, and changes in texture. Some women also have hair loss during or after pregnancy, or hair that feels dry or thin. Your hair will most likely return to normal after your baby is born.  Your breasts will continue to grow and they will continue to become tender. A yellow fluid (colostrum) may leak from your breasts. This is the first milk you are producing for your baby.  Your belly button may stick out.  You may notice more swelling in your hands,  face, or ankles.  You may have increased tingling or numbness in your hands, arms, and legs. The skin on your belly may also feel numb.  You may feel short of breath because of your expanding uterus.  You may have more problems sleeping. This can be caused by the size of your belly, increased need to urinate, and an increase in your body's metabolism.  You may notice the fetus "dropping," or moving lower in your abdomen (lightening).  You may have increased vaginal discharge.  You may notice your joints feel loose and you may have pain around your pelvic bone.  What to expect at prenatal visits You will have prenatal exams every 2 weeks until week 36. Then you will have weekly prenatal exams. During a routine prenatal visit:  You will be weighed to make sure you and the baby are growing normally.  Your blood pressure will be taken.  Your abdomen will be measured to track your baby's growth.  The fetal heartbeat will be listened to.  Any test results from the previous visit will be discussed.  You may have a cervical check near your due date to see if your cervix has softened or thinned (effaced).  You will be tested for Group B streptococcus. This happens between 35 and 37 weeks.  Your health care provider may ask you:  What your birth plan is.  How you are feeling.  If you are feeling the baby move.  If you have had   any abnormal symptoms, such as leaking fluid, bleeding, severe headaches, or abdominal cramping.  If you are using any tobacco products, including cigarettes, chewing tobacco, and electronic cigarettes.  If you have any questions.  Other tests or screenings that may be performed during your third trimester include:  Blood tests that check for low iron levels (anemia).  Fetal testing to check the health, activity level, and growth of the fetus. Testing is done if you have certain medical conditions or if there are problems during the  pregnancy.  Nonstress test (NST). This test checks the health of your baby to make sure there are no signs of problems, such as the baby not getting enough oxygen. During this test, a belt is placed around your belly. The baby is made to move, and its heart rate is monitored during movement.  What is false labor? False labor is a condition in which you feel small, irregular tightenings of the muscles in the womb (contractions) that usually go away with rest, changing position, or drinking water. These are called Braxton Hicks contractions. Contractions may last for hours, days, or even weeks before true labor sets in. If contractions come at regular intervals, become more frequent, increase in intensity, or become painful, you should see your health care provider. What are the signs of labor?  Abdominal cramps.  Regular contractions that start at 10 minutes apart and become stronger and more frequent with time.  Contractions that start on the top of the uterus and spread down to the lower abdomen and back.  Increased pelvic pressure and dull back pain.  A watery or bloody mucus discharge that comes from the vagina.  Leaking of amniotic fluid. This is also known as your "water breaking." It could be a slow trickle or a gush. Let your health care provider know if it has a color or strange odor. If you have any of these signs, call your health care provider right away, even if it is before your due date. Follow these instructions at home: Medicines  Follow your health care provider's instructions regarding medicine use. Specific medicines may be either safe or unsafe to take during pregnancy.  Take a prenatal vitamin that contains at least 600 micrograms (mcg) of folic acid.  If you develop constipation, try taking a stool softener if your health care provider approves. Eating and drinking  Eat a balanced diet that includes fresh fruits and vegetables, whole grains, good sources of protein  such as meat, eggs, or tofu, and low-fat dairy. Your health care provider will help you determine the amount of weight gain that is right for you.  Avoid raw meat and uncooked cheese. These carry germs that can cause birth defects in the baby.  If you have low calcium intake from food, talk to your health care provider about whether you should take a daily calcium supplement.  Eat four or five small meals rather than three large meals a day.  Limit foods that are high in fat and processed sugars, such as fried and sweet foods.  To prevent constipation: ? Drink enough fluid to keep your urine clear or pale yellow. ? Eat foods that are high in fiber, such as fresh fruits and vegetables, whole grains, and beans. Activity  Exercise only as directed by your health care provider. Most women can continue their usual exercise routine during pregnancy. Try to exercise for 30 minutes at least 5 days a week. Stop exercising if you experience uterine contractions.  Avoid heavy   lifting.  Do not exercise in extreme heat or humidity, or at high altitudes.  Wear low-heel, comfortable shoes.  Practice good posture.  You may continue to have sex unless your health care provider tells you otherwise. Relieving pain and discomfort  Take frequent breaks and rest with your legs elevated if you have leg cramps or low back pain.  Take warm sitz baths to soothe any pain or discomfort caused by hemorrhoids. Use hemorrhoid cream if your health care provider approves.  Wear a good support bra to prevent discomfort from breast tenderness.  If you develop varicose veins: ? Wear support pantyhose or compression stockings as told by your healthcare provider. ? Elevate your feet for 15 minutes, 3-4 times a day. Prenatal care  Write down your questions. Take them to your prenatal visits.  Keep all your prenatal visits as told by your health care provider. This is important. Safety  Wear your seat belt at  all times when driving.  Make a list of emergency phone numbers, including numbers for family, friends, the hospital, and police and fire departments. General instructions  Avoid cat litter boxes and soil used by cats. These carry germs that can cause birth defects in the baby. If you have a cat, ask someone to clean the litter box for you.  Do not travel far distances unless it is absolutely necessary and only with the approval of your health care provider.  Do not use hot tubs, steam rooms, or saunas.  Do not drink alcohol.  Do not use any products that contain nicotine or tobacco, such as cigarettes and e-cigarettes. If you need help quitting, ask your health care provider.  Do not use any medicinal herbs or unprescribed drugs. These chemicals affect the formation and growth of the baby.  Do not douche or use tampons or scented sanitary pads.  Do not cross your legs for long periods of time.  To prepare for the arrival of your baby: ? Take prenatal classes to understand, practice, and ask questions about labor and delivery. ? Make a trial run to the hospital. ? Visit the hospital and tour the maternity area. ? Arrange for maternity or paternity leave through employers. ? Arrange for family and friends to take care of pets while you are in the hospital. ? Purchase a rear-facing car seat and make sure you know how to install it in your car. ? Pack your hospital bag. ? Prepare the baby's nursery. Make sure to remove all pillows and stuffed animals from the baby's crib to prevent suffocation.  Visit your dentist if you have not gone during your pregnancy. Use a soft toothbrush to brush your teeth and be gentle when you floss. Contact a health care provider if:  You are unsure if you are in labor or if your water has broken.  You become dizzy.  You have mild pelvic cramps, pelvic pressure, or nagging pain in your abdominal area.  You have lower back pain.  You have persistent  nausea, vomiting, or diarrhea.  You have an unusual or bad smelling vaginal discharge.  You have pain when you urinate. Get help right away if:  Your water breaks before 37 weeks.  You have regular contractions less than 5 minutes apart before 37 weeks.  You have a fever.  You are leaking fluid from your vagina.  You have spotting or bleeding from your vagina.  You have severe abdominal pain or cramping.  You have rapid weight loss or weight gain.    You have shortness of breath with chest pain.  You notice sudden or extreme swelling of your face, hands, ankles, feet, or legs.  Your baby makes fewer than 10 movements in 2 hours.  You have severe headaches that do not go away when you take medicine.  You have vision changes. Summary  The third trimester is from week 28 through week 40, months 7 through 9. The third trimester is a time when the unborn baby (fetus) is growing rapidly.  During the third trimester, your discomfort may increase as you and your baby continue to gain weight. You may have abdominal, leg, and back pain, sleeping problems, and an increased need to urinate.  During the third trimester your breasts will keep growing and they will continue to become tender. A yellow fluid (colostrum) may leak from your breasts. This is the first milk you are producing for your baby.  False labor is a condition in which you feel small, irregular tightenings of the muscles in the womb (contractions) that eventually go away. These are called Braxton Hicks contractions. Contractions may last for hours, days, or even weeks before true labor sets in.  Signs of labor can include: abdominal cramps; regular contractions that start at 10 minutes apart and become stronger and more frequent with time; watery or bloody mucus discharge that comes from the vagina; increased pelvic pressure and dull back pain; and leaking of amniotic fluid. This information is not intended to replace advice  given to you by your health care provider. Make sure you discuss any questions you have with your health care provider. Document Released: 08/12/2001 Document Revised: 01/24/2016 Document Reviewed: 10/19/2012 [employer] Interactive Patient Education  2017 [employer]  

## 2017-06-16 NOTE — Progress Notes (Signed)
   PRENATAL VISIT NOTE  Subjective:  Wanda Gillespie is a 26 y.o. G1P0 at [redacted]w[redacted]d being seen today for ongoing prenatal care.  She is currently monitored for the following issues for this low-risk pregnancy and has Encounter for supervision of normal pregnancy, unspecified, unspecified trimester; GBS bacteriuria; and Excessive weight gain during pregnancy, antepartum, third trimester on her problem list.  Patient reports no complaints.  Contractions: Not present. Vag. Bleeding: None.  Movement: Present. Denies leaking of fluid.   The following portions of the patient's history were reviewed and updated as appropriate: allergies, current medications, past family history, past medical history, past social history, past surgical history and problem list. Problem list updated.  Objective:   Vitals:   06/16/17 1524  BP: 114/73  Pulse: 81  Weight: 196 lb (88.9 kg)    Fetal Status: Fetal Heart Rate (bpm): 135   Movement: Present     General:  Alert, oriented and cooperative. Patient is in no acute distress.  Skin: Skin is warm and dry. No rash noted.   Cardiovascular: Normal heart rate noted  Respiratory: Normal respiratory effort, no problems with respiration noted  Abdomen: Soft, gravid, appropriate for gestational age.  Pain/Pressure: Absent     Pelvic: Cervical exam deferred        Extremities: Normal range of motion.  Edema: None  Mental Status:  Normal mood and affect. Normal behavior. Normal judgment and thought content.   Assessment and Plan:  Pregnancy: G1P0 at [redacted]w[redacted]d  There are no diagnoses linked to this encounter. Preterm labor symptoms and general obstetric precautions including but not limited to vaginal bleeding, contractions, leaking of fluid and fetal movement were reviewed in detail with the patient. Please refer to After Visit Summary for other counseling recommendations.  2 week f/u visit    Scheryl Darter, MD

## 2017-06-30 ENCOUNTER — Ambulatory Visit (INDEPENDENT_AMBULATORY_CARE_PROVIDER_SITE_OTHER): Payer: Federal, State, Local not specified - PPO | Admitting: Obstetrics and Gynecology

## 2017-06-30 VITALS — BP 123/77 | HR 102 | Wt 199.0 lb

## 2017-06-30 DIAGNOSIS — Z3403 Encounter for supervision of normal first pregnancy, third trimester: Secondary | ICD-10-CM

## 2017-06-30 DIAGNOSIS — Z113 Encounter for screening for infections with a predominantly sexual mode of transmission: Secondary | ICD-10-CM | POA: Diagnosis not present

## 2017-06-30 DIAGNOSIS — R8271 Bacteriuria: Secondary | ICD-10-CM

## 2017-06-30 DIAGNOSIS — Z3400 Encounter for supervision of normal first pregnancy, unspecified trimester: Secondary | ICD-10-CM

## 2017-06-30 NOTE — Progress Notes (Signed)
Subjective:  Wanda Gillespie is a 26 y.o. G1P0 at 411w4d being seen today for ongoing prenatal care.  She is currently monitored for the following issues for this low-risk pregnancy and has Encounter for supervision of normal pregnancy, unspecified, unspecified trimester; GBS bacteriuria; and Excessive weight gain during pregnancy, antepartum, third trimester on her problem list.  Patient reports no complaints.  Contractions: Not present. Vag. Bleeding: None.  Movement: Present. Denies leaking of fluid.   The following portions of the patient's history were reviewed and updated as appropriate: allergies, current medications, past family history, past medical history, past social history, past surgical history and problem list. Problem list updated.  Objective:   Vitals:   06/30/17 1636  BP: 123/77  Pulse: (!) 102  Weight: 199 lb (90.3 kg)    Fetal Status: Fetal Heart Rate (bpm): 135   Movement: Present     General:  Alert, oriented and cooperative. Patient is in no acute distress.  Skin: Skin is warm and dry. No rash noted.   Cardiovascular: Normal heart rate noted  Respiratory: Normal respiratory effort, no problems with respiration noted  Abdomen: Soft, gravid, appropriate for gestational age. Pain/Pressure: Absent     Pelvic:  Cervical exam deferred        Extremities: Normal range of motion.     Mental Status: Normal mood and affect. Normal behavior. Normal judgment and thought content.   Urinalysis:      Assessment and Plan:  Pregnancy: G1P0 at 6211w4d  1. Supervision of normal first pregnancy, antepartum Labor precautions - Urine cytology ancillary only  2. GBS bacteriuria Tx while in labor  Term labor symptoms and general obstetric precautions including but not limited to vaginal bleeding, contractions, leaking of fluid and fetal movement were reviewed in detail with the patient. Please refer to After Visit Summary for other counseling recommendations.  Return in about 1  week (around 07/07/2017) for OB visit.   Hermina StaggersErvin, Mauri Temkin L, MD

## 2017-06-30 NOTE — Patient Instructions (Signed)
Vaginal Delivery Vaginal delivery means that you will give birth by pushing your baby out of your birth canal (vagina). A team of health care providers will help you before, during, and after vaginal delivery. Birth experiences are unique for every woman and every pregnancy, and birth experiences vary depending on where you choose to give birth. What should I do to prepare for my baby's birth? Before your baby is born, it is important to talk with your health care provider about:  Your labor and delivery preferences. These may include: ? Medicines that you may be given. ? How you will manage your pain. This might include non-medical pain relief techniques or injectable pain relief such as epidural analgesia. ? How you and your baby will be monitored during labor and delivery. ? Who may be in the labor and delivery room with you. ? Your feelings about surgical delivery of your baby (cesarean delivery, or C-section) if this becomes necessary. ? Your feelings about receiving donated blood through an IV tube (blood transfusion) if this becomes necessary.  Whether you are able: ? To take pictures or videos of the birth. ? To eat during labor and delivery. ? To move around, walk, or change positions during labor and delivery.  What to expect after your baby is born, such as: ? Whether delayed umbilical cord clamping and cutting is offered. ? Who will care for your baby right after birth. ? Medicines or tests that may be recommended for your baby. ? Whether breastfeeding is supported in your hospital or birth center. ? How long you will be in the hospital or birth center.  How any medical conditions you have may affect your baby or your labor and delivery experience.  To prepare for your baby's birth, you should also:  Attend all of your health care visits before delivery (prenatal visits) as recommended by your health care provider. This is important.  Prepare your home for your baby's  arrival. Make sure that you have: ? Diapers. ? Baby clothing. ? Feeding equipment. ? Safe sleeping arrangements for you and your baby.  Install a car seat in your vehicle. Have your car seat checked by a certified car seat installer to make sure that it is installed safely.  Think about who will help you with your new baby at home for at least the first several weeks after delivery.  What can I expect when I arrive at the birth center or hospital? Once you are in labor and have been admitted into the hospital or birth center, your health care provider may:  Review your pregnancy history and any concerns you have.  Insert an IV tube into one of your veins. This is used to give you fluids and medicines.  Check your blood pressure, pulse, temperature, and heart rate (vital signs).  Check whether your bag of water (amniotic sac) has broken (ruptured).  Talk with you about your birth plan and discuss pain control options.  Monitoring Your health care provider may monitor your contractions (uterine monitoring) and your baby's heart rate (fetal monitoring). You may need to be monitored:  Often, but not continuously (intermittently).  All the time or for long periods at a time (continuously). Continuous monitoring may be needed if: ? You are taking certain medicines, such as medicine to relieve pain or make your contractions stronger. ? You have pregnancy or labor complications.  Monitoring may be done by:  Placing a special stethoscope or a handheld monitoring device on your abdomen to   check your baby's heartbeat, and feeling your abdomen for contractions. This method of monitoring does not continuously record your baby's heartbeat or your contractions.  Placing monitors on your abdomen (external monitors) to record your baby's heartbeat and the frequency and length of contractions. You may not have to wear external monitors all the time.  Placing monitors inside of your uterus  (internal monitors) to record your baby's heartbeat and the frequency, length, and strength of your contractions. ? Your health care provider may use internal monitors if he or she needs more information about the strength of your contractions or your baby's heart rate. ? Internal monitors are put in place by passing a thin, flexible wire through your vagina and into your uterus. Depending on the type of monitor, it may remain in your uterus or on your baby's head until birth. ? Your health care provider will discuss the benefits and risks of internal monitoring with you and will ask for your permission before inserting the monitors.  Telemetry. This is a type of continuous monitoring that can be done with external or internal monitors. Instead of having to stay in bed, you are able to move around during telemetry. Ask your health care provider if telemetry is an option for you.  Physical exam Your health care provider may perform a physical exam. This may include:  Checking whether your baby is positioned: ? With the head toward your vagina (head-down). This is most common. ? With the head toward the top of your uterus (head-up or breech). If your baby is in a breech position, your health care provider may try to turn your baby to a head-down position so you can deliver vaginally. If it does not seem that your baby can be born vaginally, your provider may recommend surgery to deliver your baby. In rare cases, you may be able to deliver vaginally if your baby is head-up (breech delivery). ? Lying sideways (transverse). Babies that are lying sideways cannot be delivered vaginally.  Checking your cervix to determine: ? Whether it is thinning out (effacing). ? Whether it is opening up (dilating). ? How low your baby has moved into your birth canal.  What are the three stages of labor and delivery?  Normal labor and delivery is divided into the following three stages: Stage 1  Stage 1 is the  longest stage of labor, and it can last for hours or days. Stage 1 includes: ? Early labor. This is when contractions may be irregular, or regular and mild. Generally, early labor contractions are more than 10 minutes apart. ? Active labor. This is when contractions get longer, more regular, more frequent, and more intense. ? The transition phase. This is when contractions happen very close together, are very intense, and may last longer than during any other part of labor.  Contractions generally feel mild, infrequent, and irregular at first. They get stronger, more frequent (about every 2-3 minutes), and more regular as you progress from early labor through active labor and transition.  Many women progress through stage 1 naturally, but you may need help to continue making progress. If this happens, your health care provider may talk with you about: ? Rupturing your amniotic sac if it has not ruptured yet. ? Giving you medicine to help make your contractions stronger and more frequent.  Stage 1 ends when your cervix is completely dilated to 4 inches (10 cm) and completely effaced. This happens at the end of the transition phase. Stage 2  Once   your cervix is completely effaced and dilated to 4 inches (10 cm), you may start to feel an urge to push. It is common for the body to naturally take a rest before feeling the urge to push, especially if you received an epidural or certain other pain medicines. This rest period may last for up to 1-2 hours, depending on your unique labor experience.  During stage 2, contractions are generally less painful, because pushing helps relieve contraction pain. Instead of contraction pain, you may feel stretching and burning pain, especially when the widest part of your baby's head passes through the vaginal opening (crowning).  Your health care provider will closely monitor your pushing progress and your baby's progress through the vagina during stage 2.  Your  health care provider may massage the area of skin between your vaginal opening and anus (perineum) or apply warm compresses to your perineum. This helps it stretch as the baby's head starts to crown, which can help prevent perineal tearing. ? In some cases, an incision may be made in your perineum (episiotomy) to allow the baby to pass through the vaginal opening. An episiotomy helps to make the opening of the vagina larger to allow more room for the baby to fit through.  It is very important to breathe and focus so your health care provider can control the delivery of your baby's head. Your health care provider may have you decrease the intensity of your pushing, to help prevent perineal tearing.  After delivery of your baby's head, the shoulders and the rest of the body generally deliver very quickly and without difficulty.  Once your baby is delivered, the umbilical cord may be cut right away, or this may be delayed for 1-2 minutes, depending on your baby's health. This may vary among health care providers, hospitals, and birth centers.  If you and your baby are healthy enough, your baby may be placed on your chest or abdomen to help maintain the baby's temperature and to help you bond with each other. Some mothers and babies start breastfeeding at this time. Your health care team will dry your baby and help keep your baby warm during this time.  Your baby may need immediate care if he or she: ? Showed signs of distress during labor. ? Has a medical condition. ? Was born too early (prematurely). ? Had a bowel movement before birth (meconium). ? Shows signs of difficulty transitioning from being inside the uterus to being outside of the uterus. If you are planning to breastfeed, your health care team will help you begin a feeding. Stage 3  The third stage of labor starts immediately after the birth of your baby and ends after you deliver the placenta. The placenta is an organ that develops  during pregnancy to provide oxygen and nutrients to your baby in the womb.  Delivering the placenta may require some pushing, and you may have mild contractions. Breastfeeding can stimulate contractions to help you deliver the placenta.  After the placenta is delivered, your uterus should tighten (contract) and become firm. This helps to stop bleeding in your uterus. To help your uterus contract and to control bleeding, your health care provider may: ? Give you medicine by injection, through an IV tube, by mouth, or through your rectum (rectally). ? Massage your abdomen or perform a vaginal exam to remove any blood clots that are left in your uterus. ? Empty your bladder by placing a thin, flexible tube (catheter) into your bladder. ? Encourage   you to breastfeed your baby. After labor is over, you and your baby will be monitored closely to ensure that you are both healthy until you are ready to go home. Your health care team will teach you how to care for yourself and your baby. This information is not intended to replace advice given to you by your health care provider. Make sure you discuss any questions you have with your health care provider. Document Released: 05/27/2008 Document Revised: 03/07/2016 Document Reviewed: 09/02/2015 Elsevier Interactive Patient Education  2018 [employer]  

## 2017-07-02 LAB — URINE CYTOLOGY ANCILLARY ONLY
Chlamydia: NEGATIVE
Neisseria Gonorrhea: NEGATIVE

## 2017-07-07 ENCOUNTER — Ambulatory Visit (INDEPENDENT_AMBULATORY_CARE_PROVIDER_SITE_OTHER): Payer: Federal, State, Local not specified - PPO | Admitting: Obstetrics and Gynecology

## 2017-07-07 ENCOUNTER — Encounter: Payer: Self-pay | Admitting: Obstetrics and Gynecology

## 2017-07-07 VITALS — BP 124/79 | HR 92 | Wt 201.0 lb

## 2017-07-07 DIAGNOSIS — Z3400 Encounter for supervision of normal first pregnancy, unspecified trimester: Secondary | ICD-10-CM

## 2017-07-07 DIAGNOSIS — R8271 Bacteriuria: Secondary | ICD-10-CM

## 2017-07-07 NOTE — Patient Instructions (Signed)
Vaginal Delivery Vaginal delivery means that you will give birth by pushing your baby out of your birth canal (vagina). A team of health care providers will help you before, during, and after vaginal delivery. Birth experiences are unique for every woman and every pregnancy, and birth experiences vary depending on where you choose to give birth. What should I do to prepare for my baby's birth? Before your baby is born, it is important to talk with your health care provider about:  Your labor and delivery preferences. These may include: ? Medicines that you may be given. ? How you will manage your pain. This might include non-medical pain relief techniques or injectable pain relief such as epidural analgesia. ? How you and your baby will be monitored during labor and delivery. ? Who may be in the labor and delivery room with you. ? Your feelings about surgical delivery of your baby (cesarean delivery, or C-section) if this becomes necessary. ? Your feelings about receiving donated blood through an IV tube (blood transfusion) if this becomes necessary.  Whether you are able: ? To take pictures or videos of the birth. ? To eat during labor and delivery. ? To move around, walk, or change positions during labor and delivery.  What to expect after your baby is born, such as: ? Whether delayed umbilical cord clamping and cutting is offered. ? Who will care for your baby right after birth. ? Medicines or tests that may be recommended for your baby. ? Whether breastfeeding is supported in your hospital or birth center. ? How long you will be in the hospital or birth center.  How any medical conditions you have may affect your baby or your labor and delivery experience.  To prepare for your baby's birth, you should also:  Attend all of your health care visits before delivery (prenatal visits) as recommended by your health care provider. This is important.  Prepare your home for your baby's  arrival. Make sure that you have: ? Diapers. ? Baby clothing. ? Feeding equipment. ? Safe sleeping arrangements for you and your baby.  Install a car seat in your vehicle. Have your car seat checked by a certified car seat installer to make sure that it is installed safely.  Think about who will help you with your new baby at home for at least the first several weeks after delivery.  What can I expect when I arrive at the birth center or hospital? Once you are in labor and have been admitted into the hospital or birth center, your health care provider may:  Review your pregnancy history and any concerns you have.  Insert an IV tube into one of your veins. This is used to give you fluids and medicines.  Check your blood pressure, pulse, temperature, and heart rate (vital signs).  Check whether your bag of water (amniotic sac) has broken (ruptured).  Talk with you about your birth plan and discuss pain control options.  Monitoring Your health care provider may monitor your contractions (uterine monitoring) and your baby's heart rate (fetal monitoring). You may need to be monitored:  Often, but not continuously (intermittently).  All the time or for long periods at a time (continuously). Continuous monitoring may be needed if: ? You are taking certain medicines, such as medicine to relieve pain or make your contractions stronger. ? You have pregnancy or labor complications.  Monitoring may be done by:  Placing a special stethoscope or a handheld monitoring device on your abdomen to   check your baby's heartbeat, and feeling your abdomen for contractions. This method of monitoring does not continuously record your baby's heartbeat or your contractions.  Placing monitors on your abdomen (external monitors) to record your baby's heartbeat and the frequency and length of contractions. You may not have to wear external monitors all the time.  Placing monitors inside of your uterus  (internal monitors) to record your baby's heartbeat and the frequency, length, and strength of your contractions. ? Your health care provider may use internal monitors if he or she needs more information about the strength of your contractions or your baby's heart rate. ? Internal monitors are put in place by passing a thin, flexible wire through your vagina and into your uterus. Depending on the type of monitor, it may remain in your uterus or on your baby's head until birth. ? Your health care provider will discuss the benefits and risks of internal monitoring with you and will ask for your permission before inserting the monitors.  Telemetry. This is a type of continuous monitoring that can be done with external or internal monitors. Instead of having to stay in bed, you are able to move around during telemetry. Ask your health care provider if telemetry is an option for you.  Physical exam Your health care provider may perform a physical exam. This may include:  Checking whether your baby is positioned: ? With the head toward your vagina (head-down). This is most common. ? With the head toward the top of your uterus (head-up or breech). If your baby is in a breech position, your health care provider may try to turn your baby to a head-down position so you can deliver vaginally. If it does not seem that your baby can be born vaginally, your provider may recommend surgery to deliver your baby. In rare cases, you may be able to deliver vaginally if your baby is head-up (breech delivery). ? Lying sideways (transverse). Babies that are lying sideways cannot be delivered vaginally.  Checking your cervix to determine: ? Whether it is thinning out (effacing). ? Whether it is opening up (dilating). ? How low your baby has moved into your birth canal.  What are the three stages of labor and delivery?  Normal labor and delivery is divided into the following three stages: Stage 1  Stage 1 is the  longest stage of labor, and it can last for hours or days. Stage 1 includes: ? Early labor. This is when contractions may be irregular, or regular and mild. Generally, early labor contractions are more than 10 minutes apart. ? Active labor. This is when contractions get longer, more regular, more frequent, and more intense. ? The transition phase. This is when contractions happen very close together, are very intense, and may last longer than during any other part of labor.  Contractions generally feel mild, infrequent, and irregular at first. They get stronger, more frequent (about every 2-3 minutes), and more regular as you progress from early labor through active labor and transition.  Many women progress through stage 1 naturally, but you may need help to continue making progress. If this happens, your health care provider may talk with you about: ? Rupturing your amniotic sac if it has not ruptured yet. ? Giving you medicine to help make your contractions stronger and more frequent.  Stage 1 ends when your cervix is completely dilated to 4 inches (10 cm) and completely effaced. This happens at the end of the transition phase. Stage 2  Once   your cervix is completely effaced and dilated to 4 inches (10 cm), you may start to feel an urge to push. It is common for the body to naturally take a rest before feeling the urge to push, especially if you received an epidural or certain other pain medicines. This rest period may last for up to 1-2 hours, depending on your unique labor experience.  During stage 2, contractions are generally less painful, because pushing helps relieve contraction pain. Instead of contraction pain, you may feel stretching and burning pain, especially when the widest part of your baby's head passes through the vaginal opening (crowning).  Your health care provider will closely monitor your pushing progress and your baby's progress through the vagina during stage 2.  Your  health care provider may massage the area of skin between your vaginal opening and anus (perineum) or apply warm compresses to your perineum. This helps it stretch as the baby's head starts to crown, which can help prevent perineal tearing. ? In some cases, an incision may be made in your perineum (episiotomy) to allow the baby to pass through the vaginal opening. An episiotomy helps to make the opening of the vagina larger to allow more room for the baby to fit through.  It is very important to breathe and focus so your health care provider can control the delivery of your baby's head. Your health care provider may have you decrease the intensity of your pushing, to help prevent perineal tearing.  After delivery of your baby's head, the shoulders and the rest of the body generally deliver very quickly and without difficulty.  Once your baby is delivered, the umbilical cord may be cut right away, or this may be delayed for 1-2 minutes, depending on your baby's health. This may vary among health care providers, hospitals, and birth centers.  If you and your baby are healthy enough, your baby may be placed on your chest or abdomen to help maintain the baby's temperature and to help you bond with each other. Some mothers and babies start breastfeeding at this time. Your health care team will dry your baby and help keep your baby warm during this time.  Your baby may need immediate care if he or she: ? Showed signs of distress during labor. ? Has a medical condition. ? Was born too early (prematurely). ? Had a bowel movement before birth (meconium). ? Shows signs of difficulty transitioning from being inside the uterus to being outside of the uterus. If you are planning to breastfeed, your health care team will help you begin a feeding. Stage 3  The third stage of labor starts immediately after the birth of your baby and ends after you deliver the placenta. The placenta is an organ that develops  during pregnancy to provide oxygen and nutrients to your baby in the womb.  Delivering the placenta may require some pushing, and you may have mild contractions. Breastfeeding can stimulate contractions to help you deliver the placenta.  After the placenta is delivered, your uterus should tighten (contract) and become firm. This helps to stop bleeding in your uterus. To help your uterus contract and to control bleeding, your health care provider may: ? Give you medicine by injection, through an IV tube, by mouth, or through your rectum (rectally). ? Massage your abdomen or perform a vaginal exam to remove any blood clots that are left in your uterus. ? Empty your bladder by placing a thin, flexible tube (catheter) into your bladder. ? Encourage   you to breastfeed your baby. After labor is over, you and your baby will be monitored closely to ensure that you are both healthy until you are ready to go home. Your health care team will teach you how to care for yourself and your baby. This information is not intended to replace advice given to you by your health care provider. Make sure you discuss any questions you have with your health care provider. Document Released: 05/27/2008 Document Revised: 03/07/2016 Document Reviewed: 09/02/2015 Elsevier Interactive Patient Education  2018 [employer]  

## 2017-07-07 NOTE — Progress Notes (Signed)
Subjective:  Wanda Gillespie is a 26 y.o. G1P0 at 7233w4d being seen today for ongoing prenatal care.  She is currently monitored for the following issues for this low-risk pregnancy and has Encounter for supervision of normal pregnancy, unspecified, unspecified trimester; GBS bacteriuria; and Excessive weight gain during pregnancy, antepartum, third trimester on their problem list.  Patient reports no complaints.  Contractions: Not present. Vag. Bleeding: None.  Movement: Present. Denies leaking of fluid.   The following portions of the patient's history were reviewed and updated as appropriate: allergies, current medications, past family history, past medical history, past social history, past surgical history and problem list. Problem list updated.  Objective:   Vitals:   07/07/17 1540  BP: 124/79  Pulse: 92  Weight: 201 lb (91.2 kg)    Fetal Status: Fetal Heart Rate (bpm): 140   Movement: Present     General:  Alert, oriented and cooperative. Patient is in no acute distress.  Skin: Skin is warm and dry. No rash noted.   Cardiovascular: Normal heart rate noted  Respiratory: Normal respiratory effort, no problems with respiration noted  Abdomen: Soft, gravid, appropriate for gestational age. Pain/Pressure: Absent     Pelvic:  Cervical exam deferred        Extremities: Normal range of motion.  Edema: Trace  Mental Status: Normal mood and affect. Normal behavior. Normal judgment and thought content.   Urinalysis:      Assessment and Plan:  Pregnancy: G1P0 at 8033w4d  1. GBS bacteriuria Tx while in labor  2. Supervision of normal first pregnancy, antepartum Stable Labor precautions  Term labor symptoms and general obstetric precautions including but not limited to vaginal bleeding, contractions, leaking of fluid and fetal movement were reviewed in detail with the patient. Please refer to After Visit Summary for other counseling recommendations.  Return in about 1 week (around  07/14/2017) for OB visit.   Hermina StaggersErvin, Kerrie Timm L, MD

## 2017-07-14 ENCOUNTER — Other Ambulatory Visit: Payer: Self-pay

## 2017-07-14 ENCOUNTER — Ambulatory Visit (INDEPENDENT_AMBULATORY_CARE_PROVIDER_SITE_OTHER): Payer: Federal, State, Local not specified - PPO | Admitting: Certified Nurse Midwife

## 2017-07-14 VITALS — BP 132/75 | HR 85 | Wt 200.9 lb

## 2017-07-14 DIAGNOSIS — R8271 Bacteriuria: Secondary | ICD-10-CM

## 2017-07-14 DIAGNOSIS — Z3400 Encounter for supervision of normal first pregnancy, unspecified trimester: Secondary | ICD-10-CM

## 2017-07-14 NOTE — Progress Notes (Signed)
   PRENATAL VISIT NOTE  Subjective:  Wanda Gillespie is a [age] y.o. G1P0 at 1133w4d being seen today for ongoing prenatal care.  She is currently monitored for the following issues for this low-risk pregnancy and has Encounter for supervision of normal pregnancy, unspecified, unspecified trimester; GBS bacteriuria; and Excessive weight gain during pregnancy, antepartum, third trimester on their problem list.  Patient reports no bleeding, no contractions, no cramping and no leaking.  Contractions: Not present. Vag. Bleeding: None.  Movement: Present. Denies leaking of fluid.   The following portions of the patient's history were reviewed and updated as appropriate: allergies, current medications, past family history, past medical history, past social history, past surgical history and problem list. Problem list updated.  Objective:   Vitals:   07/14/17 1608  BP: 132/75  Pulse: 85  Weight: 200 lb 14.4 oz (91.1 kg)    Fetal Status: Fetal Heart Rate (bpm): 150; doppler Fundal Height: 36 cm Movement: Present     General:  Alert, oriented and cooperative. Patient is in no acute distress.  Skin: Skin is warm and dry. No rash noted.   Cardiovascular: Normal heart rate noted  Respiratory: Normal respiratory effort, no problems with respiration noted  Abdomen: Soft, gravid, appropriate for gestational age.  Pain/Pressure: Absent     Pelvic: Cervical exam deferred        Extremities: Normal range of motion.  Edema: Trace  Mental Status:  Normal mood and affect. Normal behavior. Normal judgment and thought content.   Assessment and Plan:  Pregnancy: G1P0 at 3233w4d  1. GBS bacteriuria     PCN for labor/delivery  2. Supervision of normal first pregnancy, antepartum      Doing well.  Term labor symptoms and general obstetric precautions including but not limited to vaginal bleeding, contractions, leaking of fluid and fetal movement were reviewed in detail with the patient. Please refer to After  Visit Summary for other counseling recommendations.  Return in about 1 week (around 07/21/2017) for ROB.   Roe Coombsachelle A Rosiland Sen, CNM

## 2017-07-22 ENCOUNTER — Ambulatory Visit (INDEPENDENT_AMBULATORY_CARE_PROVIDER_SITE_OTHER): Payer: Federal, State, Local not specified - PPO | Admitting: Nurse Practitioner

## 2017-07-22 VITALS — BP 131/80 | HR 83 | Wt 202.0 lb

## 2017-07-22 DIAGNOSIS — R8271 Bacteriuria: Secondary | ICD-10-CM

## 2017-07-22 DIAGNOSIS — Z3403 Encounter for supervision of normal first pregnancy, third trimester: Secondary | ICD-10-CM

## 2017-07-22 DIAGNOSIS — O2603 Excessive weight gain in pregnancy, third trimester: Secondary | ICD-10-CM

## 2017-07-22 DIAGNOSIS — Z3400 Encounter for supervision of normal first pregnancy, unspecified trimester: Secondary | ICD-10-CM

## 2017-07-22 NOTE — Patient Instructions (Addendum)

## 2017-07-22 NOTE — Progress Notes (Signed)
    Subjective:  Wanda Gillespie is a 26 y.o. G1P0 at 4523w5d being seen today for ongoing prenatal care.  She is currently monitored for the following issues for this low-risk pregnancy and has Encounter for supervision of normal pregnancy, unspecified, unspecified trimester; GBS bacteriuria; and Excessive weight gain during pregnancy, antepartum, third trimester on their problem list.  Patient reports no complaints.  Contractions: Not present. Vag. Bleeding: None.  Movement: Present. Denies leaking of fluid.   The following portions of the patient's history were reviewed and updated as appropriate: allergies, current medications, past family history, past medical history, past social history, past surgical history and problem list. Problem list updated.  Objective:   Vitals:   07/22/17 1526  BP: 131/80  Pulse: 83  Weight: 202 lb (91.6 kg)    Fetal Status:     Movement: Present     General:  Alert, oriented and cooperative. Patient is in no acute distress.  Skin: Skin is warm and dry. No rash noted.   Cardiovascular: Normal heart rate noted  Respiratory: Normal respiratory effort, no problems with respiration noted  Abdomen: Soft, gravid, appropriate for gestational age. Pain/Pressure: Absent     Pelvic:  Cervical exam performed      Cerv8x is closed with presenting part very high  Extremities: Normal range of motion.  Edema: Trace  Mental Status: Normal mood and affect. Normal behavior. Normal judgment and thought content.      Assessment and Plan:  Pregnancy: G1P0 at 5623w5d  1. Supervision of normal first pregnancy, antepartum Presenting part is high in pelvis - reviewed pelvic exercises to rock baby into the pelvix  2. GBS bacteriuria   3. Excessive weight gain during pregnancy, antepartum, third trimester   Term labor symptoms and general obstetric precautions including but not limited to vaginal bleeding, contractions, leaking of fluid and fetal movement were reviewed in  detail with the patient. Please refer to After Visit Summary for other counseling recommendations.  Return in about 1 week (around 07/29/2017).  Nolene BernheimERRI BURLESON, RN, MSN, NP-BC Nurse Practitioner, [facility], [facility] 07/22/2017 5:01 PM

## 2017-07-24 ENCOUNTER — Inpatient Hospital Stay (HOSPITAL_COMMUNITY): Payer: Federal, State, Local not specified - PPO | Admitting: Anesthesiology

## 2017-07-24 ENCOUNTER — Encounter (HOSPITAL_COMMUNITY): Payer: Self-pay | Admitting: Certified Registered Nurse Anesthetist

## 2017-07-24 ENCOUNTER — Inpatient Hospital Stay (HOSPITAL_COMMUNITY)
Admission: AD | Admit: 2017-07-24 | Discharge: 2017-07-28 | DRG: 805 | Disposition: A | Discharge status: Home / Self Care | Payer: Federal, State, Local not specified - PPO | Source: Ambulatory Visit | Attending: Obstetrics and Gynecology | Admitting: Obstetrics and Gynecology

## 2017-07-24 ENCOUNTER — Other Ambulatory Visit: Payer: Self-pay

## 2017-07-24 ENCOUNTER — Encounter (HOSPITAL_COMMUNITY): Payer: Self-pay | Admitting: *Deleted

## 2017-07-24 DIAGNOSIS — O411230 Chorioamnionitis, third trimester, not applicable or unspecified: Secondary | ICD-10-CM | POA: Diagnosis present

## 2017-07-24 DIAGNOSIS — O4292 Full-term premature rupture of membranes, unspecified as to length of time between rupture and onset of labor: Secondary | ICD-10-CM | POA: Diagnosis present

## 2017-07-24 DIAGNOSIS — Z3A40 40 weeks gestation of pregnancy: Secondary | ICD-10-CM

## 2017-07-24 DIAGNOSIS — O99824 Streptococcus B carrier state complicating childbirth: Secondary | ICD-10-CM | POA: Diagnosis present

## 2017-07-24 DIAGNOSIS — O4290 Premature rupture of membranes, unspecified as to length of time between rupture and onset of labor, unspecified weeks of gestation: Secondary | ICD-10-CM | POA: Diagnosis present

## 2017-07-24 DIAGNOSIS — Z8759 Personal history of other complications of pregnancy, childbirth and the puerperium: Secondary | ICD-10-CM

## 2017-07-24 LAB — TYPE AND SCREEN
ABO/RH(D): A POS
ANTIBODY SCREEN: NEGATIVE

## 2017-07-24 LAB — URINALYSIS, ROUTINE W REFLEX MICROSCOPIC
BILIRUBIN URINE: NEGATIVE
GLUCOSE, UA: 50 mg/dL — AB
KETONES UR: NEGATIVE mg/dL
NITRITE: NEGATIVE
PH: 7 (ref 5.0–8.0)
PROTEIN: NEGATIVE mg/dL
Specific Gravity, Urine: 1.014 (ref 1.005–1.030)

## 2017-07-24 LAB — POCT FERN TEST: POCT Fern Test: POSITIVE

## 2017-07-24 LAB — CBC
HEMATOCRIT: 43.6 % (ref 36.0–46.0)
HEMOGLOBIN: 14.5 g/dL (ref 12.0–15.0)
MCH: 29.2 pg (ref 26.0–34.0)
MCHC: 33.3 g/dL (ref 30.0–36.0)
MCV: 87.9 fL (ref 78.0–100.0)
Platelets: 137 10*3/uL — ABNORMAL LOW (ref 150–400)
RBC: 4.96 MIL/uL (ref 3.87–5.11)
RDW: 13.2 % (ref 11.5–15.5)
WBC: 8.8 10*3/uL (ref 4.0–10.5)

## 2017-07-24 LAB — ABO/RH: ABO/RH(D): A POS

## 2017-07-24 MED ORDER — PHENYLEPHRINE 40 MCG/ML (10ML) SYRINGE FOR IV PUSH (FOR BLOOD PRESSURE SUPPORT)
80.0000 ug | PREFILLED_SYRINGE | INTRAVENOUS | Status: DC | PRN
  Filled 2017-07-24: qty 5

## 2017-07-24 MED ORDER — TERBUTALINE SULFATE 1 MG/ML IJ SOLN
0.2500 mg | Freq: Once | INTRAMUSCULAR | Status: AC | PRN
  Administered 2017-07-24: 0.25 mg via SUBCUTANEOUS
  Filled 2017-07-24: qty 1

## 2017-07-24 MED ORDER — EPHEDRINE 5 MG/ML INJ
10.0000 mg | INTRAVENOUS | Status: DC | PRN
  Filled 2017-07-24: qty 2

## 2017-07-24 MED ORDER — LIDOCAINE HCL (PF) 1 % IJ SOLN
30.0000 mL | INTRAMUSCULAR | Status: AC | PRN
  Administered 2017-07-26: 30 mL via SUBCUTANEOUS
  Filled 2017-07-24: qty 30

## 2017-07-24 MED ORDER — SOD CITRATE-CITRIC ACID 500-334 MG/5ML PO SOLN
30.0000 mL | ORAL | Status: DC | PRN
  Filled 2017-07-24: qty 15

## 2017-07-24 MED ORDER — LACTATED RINGERS IV SOLN
INTRAVENOUS | Status: DC
  Administered 2017-07-24: 150 mL/h via INTRAUTERINE
  Administered 2017-07-24 – 2017-07-25 (×3): via INTRAUTERINE

## 2017-07-24 MED ORDER — OXYCODONE-ACETAMINOPHEN 5-325 MG PO TABS: 2.0000 | ORAL_TABLET | ORAL | Status: DC | PRN

## 2017-07-24 MED ORDER — PENICILLIN G POTASSIUM 5000000 UNITS IJ SOLR
5.0000 10*6.[IU] | Freq: Once | INTRAVENOUS | Status: AC
  Administered 2017-07-24: 5 10*6.[IU] via INTRAVENOUS
  Filled 2017-07-24: qty 5

## 2017-07-24 MED ORDER — PENICILLIN G POT IN DEXTROSE 60000 UNIT/ML IV SOLN
3.0000 10*6.[IU] | INTRAVENOUS | Status: DC
  Administered 2017-07-24 – 2017-07-25 (×3): 3 10*6.[IU] via INTRAVENOUS
  Filled 2017-07-24 (×6): qty 50

## 2017-07-24 MED ORDER — FLEET ENEMA 7-19 GM/118ML RE ENEM: 1.0000 | ENEMA | RECTAL | Status: DC | PRN

## 2017-07-24 MED ORDER — MISOPROSTOL 200 MCG PO TABS
50.0000 ug | ORAL_TABLET | ORAL | Status: DC | PRN
  Filled 2017-07-24: qty 1

## 2017-07-24 MED ORDER — AMPICILLIN SODIUM 2 G IJ SOLR
2.0000 g | Freq: Four times a day (QID) | INTRAMUSCULAR | Status: DC
  Administered 2017-07-25 – 2017-07-26 (×5): 2 g via INTRAVENOUS
  Filled 2017-07-24 (×7): qty 2000

## 2017-07-24 MED ORDER — FENTANYL 2.5 MCG/ML BUPIVACAINE 1/10 % EPIDURAL INFUSION (WH - ANES)
14.0000 mL/h | INTRAMUSCULAR | Status: DC | PRN
  Administered 2017-07-24 – 2017-07-25 (×5): 14 mL/h via EPIDURAL
  Filled 2017-07-24 (×5): qty 100

## 2017-07-24 MED ORDER — FENTANYL 2.5 MCG/ML BUPIVACAINE 1/10 % EPIDURAL INFUSION (WH - ANES): 14.0000 mL/h | INTRAMUSCULAR | Status: DC | PRN

## 2017-07-24 MED ORDER — OXYTOCIN 40 UNITS IN LACTATED RINGERS INFUSION - SIMPLE MED
2.5000 [IU]/h | INTRAVENOUS | Status: DC
  Filled 2017-07-24: qty 1000

## 2017-07-24 MED ORDER — FENTANYL CITRATE (PF) 100 MCG/2ML IJ SOLN
100.0000 ug | Freq: Once | INTRAMUSCULAR | Status: AC
  Administered 2017-07-24: 100 ug via INTRAVENOUS
  Filled 2017-07-24: qty 2

## 2017-07-24 MED ORDER — ONDANSETRON HCL 4 MG/2ML IJ SOLN: 4.0000 mg | Freq: Four times a day (QID) | INTRAMUSCULAR | Status: DC | PRN

## 2017-07-24 MED ORDER — ACETAMINOPHEN 325 MG PO TABS: 650.0000 mg | ORAL_TABLET | ORAL | Status: DC | PRN

## 2017-07-24 MED ORDER — LACTATED RINGERS IV SOLN
INTRAVENOUS | Status: DC
  Administered 2017-07-24 – 2017-07-25 (×5): via INTRAVENOUS

## 2017-07-24 MED ORDER — LACTATED RINGERS IV SOLN
500.0000 mL | Freq: Once | INTRAVENOUS | Status: AC
  Administered 2017-07-24: 500 mL via INTRAVENOUS

## 2017-07-24 MED ORDER — OXYTOCIN BOLUS FROM INFUSION
500.0000 mL | Freq: Once | INTRAVENOUS | Status: AC
  Administered 2017-07-26: 500 mL via INTRAVENOUS

## 2017-07-24 MED ORDER — DIPHENHYDRAMINE HCL 50 MG/ML IJ SOLN: 12.5000 mg | INTRAMUSCULAR | Status: DC | PRN

## 2017-07-24 MED ORDER — GENTAMICIN SULFATE 40 MG/ML IJ SOLN
5.0000 mg/kg | INTRAVENOUS | Status: DC
  Administered 2017-07-25 – 2017-07-26 (×2): 350 mg via INTRAVENOUS
  Filled 2017-07-24 (×2): qty 8.75

## 2017-07-24 MED ORDER — OXYTOCIN 40 UNITS IN LACTATED RINGERS INFUSION - SIMPLE MED
1.0000 m[IU]/min | INTRAVENOUS | Status: DC
  Administered 2017-07-24: 2 m[IU]/min via INTRAVENOUS
  Administered 2017-07-25: 1 m[IU]/min via INTRAVENOUS

## 2017-07-24 MED ORDER — PHENYLEPHRINE 40 MCG/ML (10ML) SYRINGE FOR IV PUSH (FOR BLOOD PRESSURE SUPPORT)
80.0000 ug | PREFILLED_SYRINGE | INTRAVENOUS | Status: DC | PRN
  Filled 2017-07-24: qty 5
  Filled 2017-07-24: qty 10

## 2017-07-24 MED ORDER — LACTATED RINGERS IV SOLN
500.0000 mL | INTRAVENOUS | Status: DC | PRN
  Administered 2017-07-24 (×2): 500 mL via INTRAVENOUS
  Administered 2017-07-25: 250 mL via INTRAVENOUS
  Administered 2017-07-25: 500 mL via INTRAVENOUS

## 2017-07-24 MED ORDER — LIDOCAINE HCL (PF) 1 % IJ SOLN
INTRAMUSCULAR | Status: DC | PRN
Start: 2017-07-24 — End: 2017-07-26
  Administered 2017-07-24 (×2): 4 mL

## 2017-07-24 MED ORDER — OXYCODONE-ACETAMINOPHEN 5-325 MG PO TABS
1.0000 | ORAL_TABLET | ORAL | Status: DC | PRN
Start: 2017-07-24 — End: 2017-07-26
  Administered 2017-07-26: 1 via ORAL
  Filled 2017-07-24: qty 1

## 2017-07-24 NOTE — H&P (Signed)
LABOR AND DELIVERY ADMISSION HISTORY AND PHYSICAL NOTE  Wanda Gillespie is a 26 y.o. female G1P0 with IUP at 4998w0d by US presenting for SVD after SROM.  She reports positive fetal movement. She denies significant vaginal bleeding.  [facility] Pregnancy complications:  - GBS pos  Past Medical History: History reviewed. No pertinent past medical history.  Past Surgical History: Past Surgical History:  Procedure Laterality Date  . DILATION AND CURETTAGE OF UTERUS      Obstetrical History: OB History    Gravida Para Term Preterm AB Living   1             SAB TAB Ectopic Multiple Live Births                  Social History: Social History   Socioeconomic History  . Marital status: Single    Spouse name: None  . Number of children: None  . Years of education: None  . Highest education level: None  Social Needs  . Financial resource strain: None  . Food insecurity - worry: None  . Food insecurity - inability: None  . Transportation needs - medical: None  . Transportation needs - non-medical: None  Occupational History  . None  Tobacco Use  . Smoking status: Never Smoker  . Smokeless tobacco: Never Used  Substance and Sexual Activity  . Alcohol use: No  . Drug use: No  . Sexual activity: Yes    Birth control/protection: None  Other Topics Concern  . None  Social History Narrative  . None    Family History: Family History  Problem Relation Age of Onset  . Heart disease Maternal Grandmother   . Heart disease Paternal Grandmother     Allergies: Not on File  Medications Prior to Admission  Medication Sig Dispense Refill Last Dose  . Prenatal MV-Min-Fe Cbn-FA-DHA (PRENATAL PLUS DHA) 7-0.4-100 MG TABS Take 1 tablet by mouth daily. 60 each 11 Taking     Review of Systems  All systems reviewed and negative except as stated in HPI  Physical Exam Blood pressure 131/78, pulse 80, temperature 97.9 F (36.6 C), temperature source  Oral, resp. rate 18, height 5\' 4"  (1.626 m), weight 202 lb (91.6 kg), last menstrual period 10/25/2016, SpO2 98 %. General appearance: alert, cooperative and no distress Lungs: clear to auscultation bilaterally Heart: regular rate and rhythm Abdomen: soft, non-tender; bowel sounds normal Extremities: No calf swelling or tenderness Presentation: cephalic Fetal monitoring: Cat 1 Uterine activity: intermittent contractions 4-6 minutes Dilation: 1.5 Effacement (%): 50 Station: Ballotable Exam by:: Mellissa KohutAnnette Millner RN   Prenatal labs: ABO, Rh: A/Positive/-- (06/07 1609) Antibody: Negative (06/07 1609) Rubella: 1.48 (06/07 1609) RPR: Non Reactive (09/04 1047)  HBsAg: Negative (06/07 1609)  HIV:   non-reactive GC/Chlamydia: neg GBS:   pos 1 hr Glucola: 159 Genetic screening:  neg Anatomy US: normal  Prenatal Transfer Tool  Maternal Diabetes: No Genetic Screening: Normal Maternal Ultrasounds/Referrals: Normal Fetal Ultrasounds or other Referrals:  None Maternal Substance Abuse:  No Significant Maternal Medications:  None Significant Maternal Lab Results: None  Results for orders placed or performed during the hospital encounter of 07/24/17 (from the past 24 hour(s))  Urinalysis, Routine w reflex microscopic   Collection Time: 07/24/17 12:11 PM  Result Value Ref Range   Color, Urine YELLOW YELLOW   APPearance CLEAR CLEAR   Specific Gravity, Urine 1.014 1.005 - 1.030   pH 7.0 5.0 - 8.0   Glucose, UA 50 (A)  NEGATIVE mg/dL   Hgb urine dipstick MODERATE (A) NEGATIVE   Bilirubin Urine NEGATIVE NEGATIVE   Ketones, ur NEGATIVE NEGATIVE mg/dL   Protein, ur NEGATIVE NEGATIVE mg/dL   Nitrite NEGATIVE NEGATIVE   Leukocytes, UA SMALL (A) NEGATIVE   RBC / HPF TOO NUMEROUS TO COUNT 0 - 5 RBC/hpf   WBC, UA 0-5 0 - 5 WBC/hpf   Bacteria, UA RARE (A) NONE SEEN   Squamous Epithelial / LPF 0-5 (A) NONE SEEN   Mucus PRESENT   Fern Test   Collection Time: 07/24/17  1:03 PM  Result Value Ref  Range   POCT Fern Test Positive = ruptured amniotic membanes     Patient Active Problem List   Diagnosis Date Noted  . Excessive weight gain during pregnancy, antepartum, third trimester 05/05/2017  . GBS bacteriuria 04/07/2017  . Encounter for supervision of normal pregnancy, unspecified, unspecified trimester 02/05/2017    Assessment: Wanda Gillespie is a 26 y.o. G1P0 at 2138w0d here for SVD 2/2 to SROM  #Labor: expectant managment #Pain: Tylenol/fentanyl #FWB: Cat 1 #ID:  GBS pos #MOF: both #MOC:POP #Circ:  n/a  Marthenia RollingScott Dafney Farler 07/24/2017, 1:15 PM

## 2017-07-24 NOTE — Progress Notes (Signed)
Wanda Gillespie is a 26 y.o. G1P0 at 255w0d.  Subjective: Patient feeling comfortable with epidural  Objective: BP (!) 120/58   Pulse 89   Temp 98.3 F (36.8 C) (Oral)   Resp 18   Ht 5\' 4"  (1.626 m)   Wt 202 lb (91.6 kg)   LMP 10/25/2016   SpO2 99%   BMI 34.67 kg/m    FHT:  FHR: 130 bpm, variability: appropriate,  accelerations:  10x10,  decelerations:  none UC:   Q 2-575minutes, 60-70sec Dilation: 3 Effacement (%): 60 Cervical Position: Middle Station: -2 Presentation: Vertex Exam by:: AvayaJaton Burgess   Labs: Results for orders placed or performed during the hospital encounter of 07/24/17 (from the past 24 hour(s))  Urinalysis, Routine w reflex microscopic     Status: Abnormal   Collection Time: 07/24/17 12:11 PM  Result Value Ref Range   Color, Urine YELLOW YELLOW   APPearance CLEAR CLEAR   Specific Gravity, Urine 1.014 1.005 - 1.030   pH 7.0 5.0 - 8.0   Glucose, UA 50 (A) NEGATIVE mg/dL   Hgb urine dipstick MODERATE (A) NEGATIVE   Bilirubin Urine NEGATIVE NEGATIVE   Ketones, ur NEGATIVE NEGATIVE mg/dL   Protein, ur NEGATIVE NEGATIVE mg/dL   Nitrite NEGATIVE NEGATIVE   Leukocytes, UA SMALL (A) NEGATIVE   RBC / HPF TOO NUMEROUS TO COUNT 0 - 5 RBC/hpf   WBC, UA 0-5 0 - 5 WBC/hpf   Bacteria, UA RARE (A) NONE SEEN   Squamous Epithelial / LPF 0-5 (A) NONE SEEN   Mucus PRESENT   Fern Test     Status: Abnormal   Collection Time: 07/24/17  1:03 PM  Result Value Ref Range   POCT Fern Test Positive = ruptured amniotic membanes   CBC     Status: Abnormal   Collection Time: 07/24/17  1:55 PM  Result Value Ref Range   WBC 8.8 4.0 - 10.5 K/uL   RBC 4.96 3.87 - 5.11 MIL/uL   Hemoglobin 14.5 12.0 - 15.0 g/dL   HCT 96.043.6 45.436.0 - 09.846.0 %   MCV 87.9 78.0 - 100.0 fL   MCH 29.2 26.0 - 34.0 pg   MCHC 33.3 30.0 - 36.0 g/dL   RDW 11.913.2 14.711.5 - 82.915.5 %   Platelets 137 (L) 150 - 400 K/uL  Type and screen [facility]     Status: None   Collection Time: 07/24/17  1:55 PM   Result Value Ref Range   ABO/RH(D) A POS    Antibody Screen NEG    Sample Expiration 07/27/2017     Assessment / Plan: 6455w0d week IUP Labor: expectant management, pitocin Fetal Wellbeing:  Category 1 Pain Control:  Epidural, well controlled Anticipated MOD:  SVD  Marthenia RollingBland, Wanda Netherland, DO 07/24/2017 7:07 PM

## 2017-07-24 NOTE — MAU Note (Signed)
Pt c/o Vaginal bleeding and cramping starting at 1000 this morning. +FM per patient

## 2017-07-24 NOTE — Progress Notes (Signed)
Diallo MD and Rachelle HoraMoss MD advising to watch strip for a few more minutes. No new orders at this time.

## 2017-07-24 NOTE — Anesthesia Pain Management Evaluation Note (Signed)
  CRNA Pain Management Visit Note  Patient: Wanda Gillespie, 26 y.o., female  "Hello I am a [facility] the [facility]. We have an anesthesia team available at all times to provide care throughout the hospital, including epidural management and anesthesia for C-section. I don't know your plan for the delivery whether it a natural birth, water birth, IV sedation, nitrous supplementation, doula or epidural, but we want to meet your pain goals."   1.Was your pain managed to your expectations on prior hospitalizations?   No   2.What is your expectation for pain management during this hospitalization?     IV pain meds  3.How can we help you reach that goal? unsure  Record the patient's initial score and the patient's pain goal.   Pain: 3  Pain Goal: 6 [facility] wants you to be able to say your pain was always managed very well.  Laban EmperorMalinova,Jayme Cham Hristova 07/24/2017

## 2017-07-24 NOTE — Anesthesia Procedure Notes (Signed)
Epidural Patient location during procedure: OB  Staffing Anesthesiologist: Bradden Tadros, MD Performed: anesthesiologist   Preanesthetic Checklist Completed: patient identified, pre-op evaluation, timeout performed, IV checked, risks and benefits discussed and monitors and equipment checked  Epidural Patient position: sitting Prep: site prepped and draped and DuraPrep Patient monitoring: heart rate, continuous pulse ox and blood pressure Approach: midline Location: L3-L4 Injection technique: LOR air and LOR saline  Needle:  Needle type: Tuohy  Needle gauge: 17 G Needle length: 9 cm Needle insertion depth: 7 cm Catheter type: closed end flexible Catheter size: 19 Gauge Catheter at skin depth: 12 cm Test dose: negative  Assessment Sensory level: T8 Events: blood not aspirated, injection not painful, no injection resistance, negative IV test and no paresthesia  Additional Notes Reason for block:procedure for pain     

## 2017-07-24 NOTE — MAU Note (Signed)
Urine sent to lab 

## 2017-07-24 NOTE — Anesthesia Preprocedure Evaluation (Signed)
Anesthesia Evaluation  Patient identified by MRN, date of birth, ID band Patient awake    Reviewed: Allergy & Precautions, NPO status , Patient's Chart, lab work & pertinent test results  Airway Mallampati: II  TM Distance: >3 FB Neck ROM: Full    Dental no notable dental hx.    Pulmonary neg pulmonary ROS,    Pulmonary exam normal breath sounds clear to auscultation       Cardiovascular negative cardio ROS Normal cardiovascular exam Rhythm:Regular Rate:Normal     Neuro/Psych negative neurological ROS  negative psych ROS   GI/Hepatic negative GI ROS, Neg liver ROS,   Endo/Other  negative endocrine ROS  Renal/GU negative Renal ROS     Musculoskeletal negative musculoskeletal ROS (+)   Abdominal   Peds  Hematology negative hematology ROS (+)   Anesthesia Other Findings   Reproductive/Obstetrics (+) Pregnancy                             Anesthesia Physical Anesthesia Plan  ASA: II  Anesthesia Plan: Epidural   Post-op Pain Management:    Induction:   PONV Risk Score and Plan:   Airway Management Planned:   Additional Equipment:   Intra-op Plan:   Post-operative Plan:   Informed Consent: I have reviewed the patients History and Physical, chart, labs and discussed the procedure including the risks, benefits and alternatives for the proposed anesthesia with the patient or authorized representative who has indicated his/her understanding and acceptance.       Plan Discussed with:   Anesthesia Plan Comments:         Anesthesia Quick Evaluation  

## 2017-07-25 LAB — RPR: RPR Ser Ql: NONREACTIVE

## 2017-07-25 MED ORDER — ACETAMINOPHEN 500 MG PO TABS
1000.0000 mg | ORAL_TABLET | Freq: Once | ORAL | Status: AC
  Administered 2017-07-25: 1000 mg via ORAL
  Filled 2017-07-25: qty 2

## 2017-07-25 MED ORDER — LACTATED RINGERS IV SOLN
INTRAVENOUS | Status: DC
  Administered 2017-07-25: 01:00:00 via INTRAUTERINE

## 2017-07-25 NOTE — Progress Notes (Signed)
MD reviewed strip and is advising that variability is good and no new interventions needed at this time.

## 2017-07-25 NOTE — Progress Notes (Signed)
Patient ID: Wanda Gillespie, female   DOB: 06/26/1991, 26 y.o.   MRN: [id number]  Pt having some back pain, minimal pressure BP 131/80, other VSS FHR 120s, +accels, no decels, occ variables Ctx q 2 mins with Pit at 510mu/min Cx ant lip/90/vtx +1/+2  IUP@term  Protracted active phase ROM x 30hr Triple I (based on fetal tachy, no fever)  Will increase Pit and recheck cx  Cam HaiSHAW, KIMBERLY CNM 07/25/2017 8:53 PM

## 2017-07-25 NOTE — Progress Notes (Signed)
Labor Progress Note Wanda Gillespie is a 26 y.o. G1P0 at 1876w1d presented for SROM since 1300. S: Called to bedside for variable decelerations and prolonged decelerations. Patient has no complaints  O:  BP 125/68   Pulse (!) 108   Temp 99 F (37.2 C) (Rectal)   Resp 16   Ht 5\' 4"  (1.626 m)   Wt 202 lb (91.6 kg)   LMP 10/25/2016   SpO2 100%   BMI 34.67 kg/m  EFM: 170 bpm/mod var/variable decels/pos acels  CVE: Dilation: 5 Effacement (%): 100 Cervical Position: Middle Station: -2 Presentation: Vertex Exam by:: Suzette BattiestVeronica RN   A&P: 26 y.o. G1P0 6376w1d here for SROM. #Labor: has made some cervical change. Pitocin stopped due to decels. Position changed, given terbutaline, given amnioinfusion. At this time, baseline is tachycardic, so will start treatment for triple I. #FWB: cat 2. Reassured by moderate variability. #Triple I: maternal temp 99 deg F and fetal tachycardia. Treat with amp and gent  Mechel Schutter, DO 12:12 AM

## 2017-07-25 NOTE — Progress Notes (Signed)
Patient ID: Wanda Gillespie, female   DOB: 10/15/1990, 26 y.o.   MRN: [id number]  Pt still comfortable w/ epidural BP 114/67, other VSS FHR 120s, +accels, occ variables, +variability Ctx q 2mins w/ Pit @ 5713mu/min Cx 8/90/-1  IUP@term  SROM x 24h (Amp & Gent for previous FHR tachy) Active labor  Will check cx in 2 hrs or sooner prn Anticipate SVD  Cam HaiSHAW, Madisyn Mawhinney CNM 07/25/2017 1:44 PM

## 2017-07-25 NOTE — Progress Notes (Signed)
Patient ID: Wanda MaduraAshlynn Gillespie, female   DOB: 02/07/1991, 26 y.o.   MRN: [id number]  Mostly comfortable w/ epidural VSS, T 98.4 FHR 120s, +accels, no decels, +LTV Ctx q 2 mins w/ Pit @ 137mu/min (IUPC in place but waveform dampened so MVUs not reliable) Cx 6-7/90/-2  IUP@term  SROM x 22+ hrs Triple I (Amp/Gent) Active labor  Continue to increase Pit to keep ctx reg Check cx in 2 hrs Anticipate SVD  Cam HaiSHAW, KIMBERLY CNM 07/25/2017 11:30 AM

## 2017-07-25 NOTE — Progress Notes (Signed)
Informed CNM of IUPC removal (per MD order) and contraction pattern.  Palpation of abd is now firm with minimal rest between contractions. Offered decrease of pitocin at this time and provider declines and states she will come assess pt and recheck sve.

## 2017-07-25 NOTE — Progress Notes (Signed)
Patient ID: Wanda MaduraAshlynn Gillespie, female   DOB: 05/24/1991, 26 y.o.   MRN: [id number]  Having some back discomfort, but otherwise epidural working well  VSS, afeb FHR 120s, +accels, occ mi variables Ctx q 2mins w/ Pit @ 13 mu/min Cx ant lip/C/Vtx 0,+1  IUP@term  Active labor/transition  Plan to check cx in 1-2 hrs or sooner w/ pressure  SHAW, KIMBERLY CNM 07/25/2017 4:22 PM

## 2017-07-26 ENCOUNTER — Encounter (HOSPITAL_COMMUNITY): Payer: Self-pay | Admitting: *Deleted

## 2017-07-26 DIAGNOSIS — O99824 Streptococcus B carrier state complicating childbirth: Secondary | ICD-10-CM

## 2017-07-26 DIAGNOSIS — Z3A40 40 weeks gestation of pregnancy: Secondary | ICD-10-CM

## 2017-07-26 LAB — CBC
HCT: 36 % (ref 36.0–46.0)
HEMOGLOBIN: 12 g/dL (ref 12.0–15.0)
MCH: 29.5 pg (ref 26.0–34.0)
MCHC: 33.3 g/dL (ref 30.0–36.0)
MCV: 88.5 fL (ref 78.0–100.0)
Platelets: 139 10*3/uL — ABNORMAL LOW (ref 150–400)
RBC: 4.07 MIL/uL (ref 3.87–5.11)
RDW: 13.3 % (ref 11.5–15.5)
WBC: 18.2 10*3/uL — ABNORMAL HIGH (ref 4.0–10.5)

## 2017-07-26 MED ORDER — TETANUS-DIPHTH-ACELL PERTUSSIS 5-2.5-18.5 LF-MCG/0.5 IM SUSP
0.5000 mL | Freq: Once | INTRAMUSCULAR | Status: DC
Start: 2017-07-27 — End: 2017-07-28

## 2017-07-26 MED ORDER — COCONUT OIL OIL: 1.0000 "application " | TOPICAL_OIL | Status: DC | PRN

## 2017-07-26 MED ORDER — ZOLPIDEM TARTRATE 5 MG PO TABS: 5.0000 mg | ORAL_TABLET | Freq: Every evening | ORAL | Status: DC | PRN

## 2017-07-26 MED ORDER — BENZOCAINE-MENTHOL 20-0.5 % EX AERO
1.0000 "application " | INHALATION_SPRAY | CUTANEOUS | Status: DC | PRN
  Administered 2017-07-27: 1 via TOPICAL
  Filled 2017-07-26 (×2): qty 56

## 2017-07-26 MED ORDER — ACETAMINOPHEN 325 MG PO TABS: 650.0000 mg | ORAL_TABLET | ORAL | Status: DC | PRN

## 2017-07-26 MED ORDER — SIMETHICONE 80 MG PO CHEW: 80.0000 mg | CHEWABLE_TABLET | ORAL | Status: DC | PRN

## 2017-07-26 MED ORDER — IBUPROFEN 600 MG PO TABS
600.0000 mg | ORAL_TABLET | Freq: Four times a day (QID) | ORAL | Status: DC
  Administered 2017-07-26 – 2017-07-28 (×8): 600 mg via ORAL
  Filled 2017-07-26 (×8): qty 1

## 2017-07-26 MED ORDER — WITCH HAZEL-GLYCERIN EX PADS: 1.0000 "application " | MEDICATED_PAD | CUTANEOUS | Status: DC | PRN

## 2017-07-26 MED ORDER — ONDANSETRON HCL 4 MG/2ML IJ SOLN: 4.0000 mg | INTRAMUSCULAR | Status: DC | PRN

## 2017-07-26 MED ORDER — PRENATAL MULTIVITAMIN CH
1.0000 | ORAL_TABLET | Freq: Every day | ORAL | Status: DC
  Administered 2017-07-26 – 2017-07-27 (×2): 1 via ORAL
  Filled 2017-07-26 (×2): qty 1

## 2017-07-26 MED ORDER — DIBUCAINE 1 % RE OINT: 1.0000 "application " | TOPICAL_OINTMENT | RECTAL | Status: DC | PRN

## 2017-07-26 MED ORDER — SENNOSIDES-DOCUSATE SODIUM 8.6-50 MG PO TABS
2.0000 | ORAL_TABLET | ORAL | Status: DC
  Administered 2017-07-26 – 2017-07-27 (×2): 2 via ORAL
  Filled 2017-07-26 (×2): qty 2

## 2017-07-26 MED ORDER — DIPHENHYDRAMINE HCL 25 MG PO CAPS
25.0000 mg | ORAL_CAPSULE | Freq: Four times a day (QID) | ORAL | Status: DC | PRN
Start: 2017-07-26 — End: 2017-07-28

## 2017-07-26 MED ORDER — ONDANSETRON HCL 4 MG PO TABS: 4.0000 mg | ORAL_TABLET | ORAL | Status: DC | PRN

## 2017-07-26 NOTE — Progress Notes (Signed)
Pt now a POSS of 1. Alert and oriented.

## 2017-07-26 NOTE — Progress Notes (Signed)
Per labor and delivery nurse baby did not latch in L&D. L&D nurse gave pt percocet before coming over to mother baby. Pt arousable put very drowsy. Unable to go over pt education/saftey at this time. Olene FlossGrandma was educated on baby safety and room orientation. Nursery nurse placed baby skin to skin with grandma, as mom was too tired/not alert enough to do skin to skin at that time. Vitals are stable.  Will need to review information with mother once she becomes more alert.

## 2017-07-26 NOTE — Progress Notes (Signed)
Patient ID: Wanda MaduraAshlynn Gillespie, female   DOB: 06/28/1991, 26 y.o.   MRN: [id number]  Pushing approx 1hr now- making progress but having significant right hip pain  VSS, afeb FHR 150s, early variables with pushing Ctx q 2-3 mins w/ Pit @ 12 mu/min Vtx visible at introitus w/ pushing  Will continue pushing Anticipate SVD  Clelia CroftSHAW, [facility] Services[facility]IM[facility]LY 07/26/2017

## 2017-07-26 NOTE — Lactation Note (Signed)
This note was copied from a baby's chart. Lactation Consultation Note: Infant is 10 hours old and has had several feeding attempts with one feeding per mother. Infant has been spoon fed colostrum.  Basic breastfeeding teaching done with mother. Reviewed hand expression and expressed 7 ml .  Mother taught football and cross cradle hold. Infant tongue thrust nipple for several mins and unable to get infant to sustain latch. Infant was finger fed 7 ml of ebm with LC gloved finger and curved tip syringe. Mother has large amts of colostrum.  Infant placed in football hold and latched after several attempts. Infant sustained latch for 20-25 mins. Observed good rhythmic suckles and frequent swallows.  Discussed cue base feeding and advised mother to do frequent skin to skin . Advised mother to feed infant at least 8-12 times in 24 hours. Mother was informed of all available LC services, BFSG'S, Outpatient dept.Mother reports that she is active with [facility]. Staff member gave mother a hand pump and mother brought her own electric pump. Mother advised to page for staff assistance with feedings as needed.   Patient Name: Wanda Gillespie Today's Date: 07/26/2017 Reason for consult: Initial assessment   Maternal Data Has patient been taught Hand Expression?: Yes Does the patient have breastfeeding experience prior to this delivery?: No  Feeding Feeding Type: Breast Fed Length of feed: 20 min(infant stilll breastfeeding when I left the room)  LATCH Score Latch: Repeated attempts needed to sustain latch, nipple held in mouth throughout feeding, stimulation needed to elicit sucking reflex.  Audible Swallowing: Spontaneous and intermittent  Type of Nipple: Everted at rest and after stimulation  Comfort (Breast/Nipple): Soft / non-tender  Hold (Positioning): Assistance needed to correctly position infant at breast and maintain latch.  LATCH Score: 8  Interventions Interventions: Breast feeding  basics reviewed;Assisted with latch;Skin to skin;Breast massage;Hand express;Breast compression;Adjust position;Support pillows;Position options;Expressed milk;Hand pump  Lactation Tools Discussed/Used     Consult Status Consult Status: Follow-up Date: 07/27/17 Follow-up type: In-patient    Stevan BornKendrick, Tirsa Gail [facility] 07/26/2017, 1:41 PM

## 2017-07-26 NOTE — Anesthesia Postprocedure Evaluation (Signed)
Anesthesia Post Note  Patient: Wanda Gillespie  Procedure(s) Performed: AN AD HOC LABOR EPIDURAL     Patient location during evaluation: Mother Baby Anesthesia Type: Epidural Level of consciousness: awake Pain management: satisfactory to patient Vital Signs Assessment: post-procedure vital signs reviewed and stable Respiratory status: spontaneous breathing Cardiovascular status: stable Anesthetic complications: no    Last Vitals:  Vitals:   07/26/17 0544 07/26/17 0631  BP: 102/66   Pulse: 67   Resp: 18   Temp: 37.2 C   SpO2: 98% 99%    Last Pain:  Vitals:   07/26/17 0544  TempSrc: Oral  PainSc: 0-No pain   Pain Goal: Patients Stated Pain Goal: 6 (07/24/17 1415)               Cephus ShellingBURGER,Tiziana Cislo

## 2017-07-26 NOTE — Progress Notes (Signed)
CRNA notified of pt sedation level. She suggested it was related to being naive to narcotics, but not epidural related. Recommended Pulse ox until more arousable, to pass on to day shift nurse, and mention to OBGYN when they round.

## 2017-07-27 NOTE — Lactation Note (Signed)
This note was copied from a baby's chart. Lactation Consultation Note  Patient Name: Girl Teresita Madurashlynn Graves [id number]'s Date: 07/27/2017 Reason for consult: Follow-up assessment;Mother's request Mom called out for assist.  She states baby won't open her mouth.  Baby is currently sleeping.  Waking techniques done. Baby placed skin to skin in football hold on left breast.  She opened wide and latched easily.  Observed feeding for 10 minutes and baby fed actively with good stimulation and breast massage. Reviewed basics.  Encouraged to call with questions/concerns or assist prn.  Maternal Data    Feeding Feeding Type: Breast Fed  LATCH Score Latch: Grasps breast easily, tongue down, lips flanged, rhythmical sucking.  Audible Swallowing: Spontaneous and intermittent  Type of Nipple: Everted at rest and after stimulation  Comfort (Breast/Nipple): Soft / non-tender  Hold (Positioning): Assistance needed to correctly position infant at breast and maintain latch.  LATCH Score: 9  Interventions Interventions: Breast feeding basics reviewed;Assisted with latch;Breast compression;Skin to skin;Adjust position;Breast massage;Support pillows  Lactation Tools Discussed/Used     Consult Status      Huston FoleyMOULDEN, Nikisha Fleece S 07/27/2017, 11:43 AM

## 2017-07-27 NOTE — Progress Notes (Signed)
POSTPARTUM PROGRESS NOTE  Post Partum Day 1 Subjective:  Wanda Gillespie is a 26 y.o. G1P1001 3359w2d s/p VAVD.  No acute events overnight.  Pt denies problems with ambulating, voiding or po intake.  She denies nausea or vomiting.  Pain is well controlled.  She has had flatus. She has not had bowel movement.  Lochia Minimal.   Objective: Blood pressure 109/64, pulse 80, temperature 98.3 F (36.8 C), temperature source Oral, resp. rate 16, height 5\' 4"  (1.626 m), weight 202 lb (91.6 kg), last menstrual period 10/25/2016, SpO2 100 %, unknown if currently breastfeeding.  Physical Exam:  General: alert, cooperative and no distress Lochia:normal flow Chest: no respiratory distress Heart:regular rate, distal pulses intact Abdomen: soft, nontender,  Uterine Fundus: firm, appropriately tender DVT Evaluation: No calf swelling or tenderness Extremities: minimal edema  Recent Labs    07/24/17 1355 07/26/17 1015  HGB 14.5 12.0  HCT 43.6 36.0    Assessment/Plan:  ASSESSMENT: Wanda Madurashlynn Holroyd is a 26 y.o. G1P1001 10659w2d s/p VAVD  Plan for discharge tomorrow   LOS: 3 days   Annalise Mcdiarmid BlandDO 07/27/2017, 9:19 AM

## 2017-07-28 MED ORDER — IBUPROFEN 600 MG PO TABS: 600.0000 mg | ORAL_TABLET | Freq: Four times a day (QID) | ORAL | 0 refills | Status: AC

## 2017-07-28 NOTE — Discharge Summary (Signed)
OB Discharge Summary     Patient Name: Wanda Gillespie DOB: 11/30/1990 MRN: [id number]  Date of admission: 07/24/2017 Delivering MD: Tilda BurrowFERGUSON, JOHN V   Date of discharge: 07/28/2017  Admitting diagnosis: 40wks discharge Intrauterine pregnancy: 3950w2d     Secondary diagnosis:  Active Problems:   Delayed delivery after SROM (spontaneous rupture of membranes)   Status post vacuum-assisted vaginal delivery  Additional problems: None     Discharge diagnosis: Term Pregnancy Delivered                                                                                                Post partum procedures:N/A  Augmentation: Pitocin  Complications: Intrauterine Inflammation or infection (Chorioamniotis), PROM >24hrs  Hospital course:  Onset of Labor With Vaginal Delivery     26 y.o. yo G1P1001 at 5750w2d was admitted in Latent Labor on 07/24/2017. Patient had an uncomplicated labor course as follows:  Membrane Rupture Time/Date: 1:00 PM ,07/24/2017   Intrapartum Procedures: Episiotomy: None [1]                                         Lacerations:  2nd degree [3];Perineal [11]  Patient had a delivery of a Viable infant. 07/26/2017  Information for the patient's newborn:  Tobin Chadarks, Girl Eldonna [[id number]][[id number]]  Delivery Method: Vaginal, Vacuum (Extractor)(Filed from Delivery Summary)    Pateint had an uncomplicated postpartum course.  She is ambulating, tolerating a regular diet, passing flatus, and urinating well. Patient is discharged home in stable condition on 07/28/17.   Physical exam  Vitals:   07/26/17 1800 07/27/17 0500 07/27/17 1751 07/28/17 0640  BP: 109/63 109/64 (!) 119/55 123/70  Pulse: 74 80 78 64  Resp: 16 16 17 16   Temp: 98.7 F (37.1 C) 98.3 F (36.8 C) 98.2 F (36.8 C) 98.4 F (36.9 C)  TempSrc: Oral Oral Oral Oral  SpO2: 100%  93% 97%  Weight:      Height:       General: alert, cooperative and no distress Lochia: appropriate Uterine Fundus: firm Incision:  N/A DVT Evaluation: No evidence of DVT seen on physical exam. Labs: Lab Results  Component Value Date   WBC 18.2 (H) 07/26/2017   HGB 12.0 07/26/2017   HCT 36.0 07/26/2017   MCV 88.5 07/26/2017   PLT 139 (L) 07/26/2017   No flowsheet data found.  Discharge instruction: per After Visit Summary and "Baby and Me Booklet".  After visit meds:  Allergies as of 07/28/2017   No Known Allergies     Medication List    TAKE these medications   ibuprofen 600 MG tablet Commonly known as:  ADVIL,MOTRIN Take 1 tablet (600 mg total) by mouth every 6 (six) hours.   PRENATAL PLUS DHA 7-0.4-100 MG Tabs Take 1 tablet by mouth daily.       Diet: routine diet  Activity: Advance as tolerated. Pelvic rest for 6 weeks.   Outpatient follow up:4 weeks Follow up Appt: Future Appointments  Date Time Provider Department Center  07/29/2017  1:15 PM Denney, Rachelle A, CNM [facility] None   Follow up Visit:No Follow-up on file.  Postpartum contraception: Progesterone only pills  Newborn Data: Live born female  Birth Weight: 6 lb 14.4 oz (3130 g) APGAR: 5, 6  Newborn Delivery   Birth date/time:  07/26/2017 02:02:00 Delivery type:  Vaginal, Vacuum (Extractor)     Baby Feeding: Breast Disposition:home with mother   07/28/2017 Lovena NeighboursAbdoulaye Diallo, MD PGY-2  OB FELLOW DISCHARGE ATTESTATION  I have seen and examined this patient. I agree with above documentation and have made edits as needed.   Caryl AdaJazma Asami Lambright, DO OB Fellow

## 2017-07-28 NOTE — Lactation Note (Signed)
This note was copied from a baby's chart. Lactation Consultation Note  Patient Name: Girl Teresita Madurashlynn Madlock ZOXWR'UToday's Date: 07/28/2017 Reason for consult: Follow-up assessment  Baby 56 hours old. Mom reports that she thinks baby is latching fine, but she admits to some nipple soreness. Assisted mom to latch baby to left breast in football position. Assisted mom with positioning and enc mom pull baby in closer when latching in order to achieve/maintain a deep latch. Mom initially allowed baby to nibble onto the breast and remain on the tip--and then mom winced in pain. Discussed with mom how a deep latch assists with milk transfer and reduces nipple soreness. Demonstrated how to flange baby's lower lip and mom reported increased comfort. Discussed when to start pumping with nursing, and mom aware of OP/BFSG and LC phone line assistance after D/C.   Maternal Data    Feeding Feeding Type: Breast Fed Length of feed: 15 min  LATCH Score Latch: Grasps breast easily, tongue down, lips flanged, rhythmical sucking.  Audible Swallowing: A few with stimulation  Type of Nipple: Everted at rest and after stimulation  Comfort (Breast/Nipple): Filling, red/small blisters or bruises, mild/mod discomfort  Hold (Positioning): Assistance needed to correctly position infant at breast and maintain latch.  LATCH Score: 7  Interventions Interventions: Breast feeding basics reviewed;Assisted with latch;Skin to skin;Hand express;Breast compression;Adjust position;Support pillows  Lactation Tools Discussed/Used     Consult Status Consult Status: PRN    Sherlyn HayJennifer D Avant Printy 07/28/2017, 10:05 AM

## 2017-07-28 NOTE — Discharge Instructions (Signed)

## 2017-07-29 ENCOUNTER — Encounter: Payer: Federal, State, Local not specified - PPO | Admitting: Certified Nurse Midwife

## 2017-07-29 DIAGNOSIS — Z8759 Personal history of other complications of pregnancy, childbirth and the puerperium: Secondary | ICD-10-CM

## 2017-08-26 ENCOUNTER — Encounter: Payer: Self-pay | Admitting: Certified Nurse Midwife

## 2017-08-26 ENCOUNTER — Ambulatory Visit (INDEPENDENT_AMBULATORY_CARE_PROVIDER_SITE_OTHER): Payer: Federal, State, Local not specified - PPO | Admitting: Certified Nurse Midwife

## 2017-08-26 DIAGNOSIS — Z1389 Encounter for screening for other disorder: Secondary | ICD-10-CM | POA: Diagnosis not present

## 2017-08-26 DIAGNOSIS — Z30011 Encounter for initial prescription of contraceptive pills: Secondary | ICD-10-CM

## 2017-08-26 MED ORDER — NORETHINDRONE 0.35 MG PO TABS: 1.0000 | ORAL_TABLET | Freq: Every day | ORAL | 11 refills | Status: DC

## 2017-08-26 NOTE — Patient Instructions (Signed)
Oral Contraception Information Oral contraceptive pills (OCPs) are medicines taken to prevent pregnancy. OCPs work by preventing the ovaries from releasing eggs. The hormones in OCPs also cause the cervical mucus to thicken, preventing the sperm from entering the uterus. The hormones also cause the uterine lining to become thin, not allowing a fertilized egg to attach to the inside of the uterus. OCPs are highly effective when taken exactly as prescribed. However, OCPs do not prevent sexually transmitted diseases (STDs). Safe sex practices, such as using condoms along with the pill, can help prevent STDs. Before taking the pill, you may have a physical exam and Pap test. Your health care provider may order blood tests. The health care provider will make sure you are a good candidate for oral contraception. Discuss with your health care provider the possible side effects of the OCP you may be prescribed. When starting an OCP, it can take 2 to 3 months for the body to adjust to the changes in hormone levels in your body. Types of oral contraception  The minipill-This pill contains the progesterone hormone only. The pill is taken every day continuously. It is very important to take the pill at the same time each day. The minipill comes in packs of 28 pills. All 28 pills contain the hormone. Advantages of oral contraceptive pills  Decreases premenstrual symptoms.  Treats menstrual period cramps.  Regulates the menstrual cycle.  Decreases a heavy menstrual flow.  May treatacne, depending on the type of pill.  Treats abnormal uterine bleeding.  Treats polycystic ovarian syndrome.  Treats endometriosis.  Can be used as emergency contraception. Things that can make oral contraceptive pills less effective OCPs can be less effective if:  You forget to take the pill at the same time every day.  You have a stomach or intestinal disease that lessens the absorption of the pill.  You take OCPs with  other medicines that make OCPs less effective, such as antibiotics, certain HIV medicines, and some seizure medicines.  You take expired OCPs.  You forget to restart the pill on day 7, when using the packs of 21 pills.  Risks associated with oral contraceptive pills Oral contraceptive pills can sometimes cause side effects, such as:  Headache.  Nausea.  Breast tenderness.  Irregular bleeding or spotting.  Combination pills are also associated with a small increased risk of:  Blood clots.  Heart attack.  Stroke.  This information is not intended to replace advice given to you by your health care provider. Make sure you discuss any questions you have with your health care provider. Document Released: 11/08/2002 Document Revised: 01/24/2016 Document Reviewed: 02/06/2013 Elsevier Interactive Patient Education  [employer]2018 Elsevier Inc.

## 2017-08-26 NOTE — Progress Notes (Signed)
Post Partum Exam  Wanda Gillespie Carearks is a 26 y.o. 251P1001 female who presents for a postpartum visit. She is 4 weeks postpartum following a vacuum assisted vaginal delivery. I have fully reviewed the prenatal and intrapartum course. The delivery was at 2381w2d gestational weeks.  Anesthesia: epidural. Postpartum course has been unremarkable. Baby's course has been unremarkable. Baby is feeding by breast. Bleeding staining only in past two days- none currently. Bowel function is normal. Bladder function is normal. Patient is not sexually active. Contraception method is oral progesterone-only contraceptive. Postpartum depression screening:neg EPDS:2   The following portions of the patient's history were reviewed and updated as appropriate: allergies, current medications, past medical history and problem list.  Review of Systems Pertinent items noted in HPI and remainder of comprehensive ROS otherwise negative.    Objective:  Last menstrual period 10/25/2016, unknown if currently breastfeeding.  General:  alert, cooperative and no distress   Breasts:  inspection negative, no nipple discharge or bleeding, no masses or nodularity palpable  Lungs: clear to auscultation bilaterally  Heart:  regular rate and rhythm, S1, S2 normal, no murmur, click, rub or gallop  Abdomen: soft, non-tender; bowel sounds normal; no masses,  no organomegaly   Vulva:  not evaluated  Vagina: not evaluated- patient declined for 2nd degree laceration to be assessed for healing   Cervix:  not evaluated  Corpus: not examined  Adnexa:  not evaluated  Rectal Exam: Not performed.        Assessment/Plan:   1. Encounter for routine postpartum follow-up -Normal postpartum exam. Pap smear not done at today's visit- last PAP on 01/2017 was normal.   2. Encounter for initial prescription of contraceptive pills -Discussed risks and benefits of POPs while breastfeeding- Patient wants progesterone only pills for contraception.  -  norethindrone (MICRONOR,CAMILA,ERRIN) 0.35 MG tablet; Take 1 tablet (0.35 mg total) by mouth daily.  Dispense: 1 Package; Refill: 11   Follow up in 1 year for well woman visit or as needed   Sharyon CableVeronica C Eulalia Ellerman, CNM 08/26/17, 3:06 PM

## 2019-06-09 IMAGING — US US MFM OB COMP +14 WKS
1 series · 14 of 28 positions shown · non-contrast
Comparison: none

[Series 1: us mfm ob comp +14 wks · 113 acquisitions, 14 frames shown]
[im 5/113]
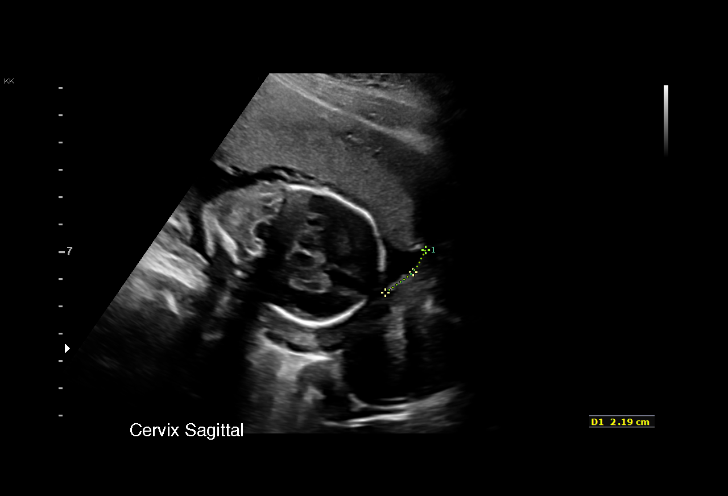
[im 13/113]
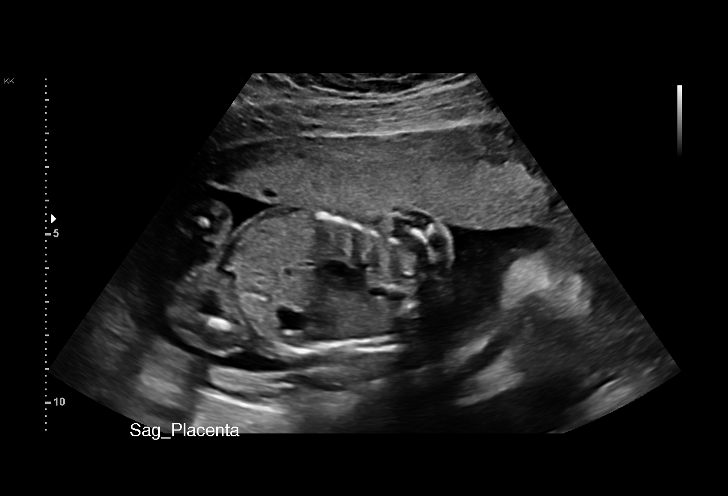
[im 21/113]
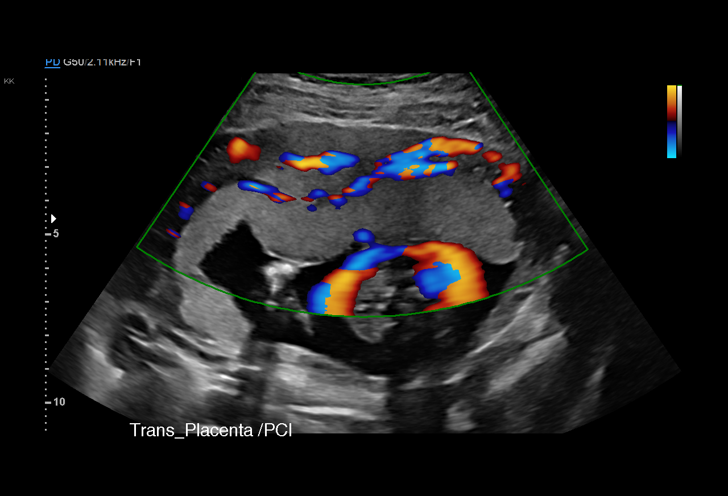
[im 30/113]
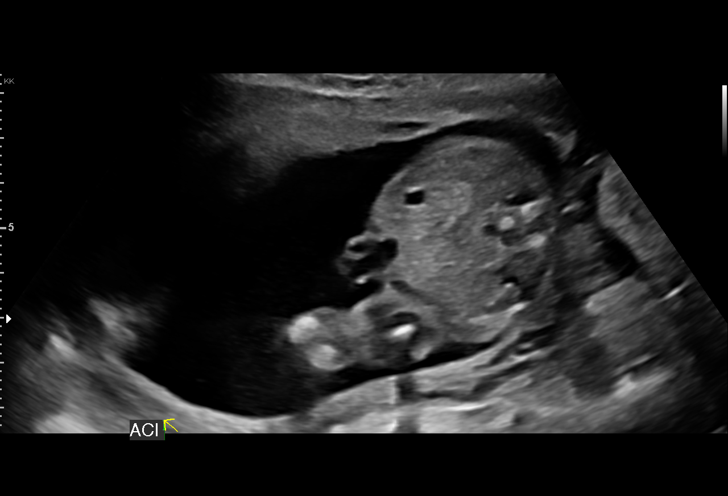
[im 38/113]
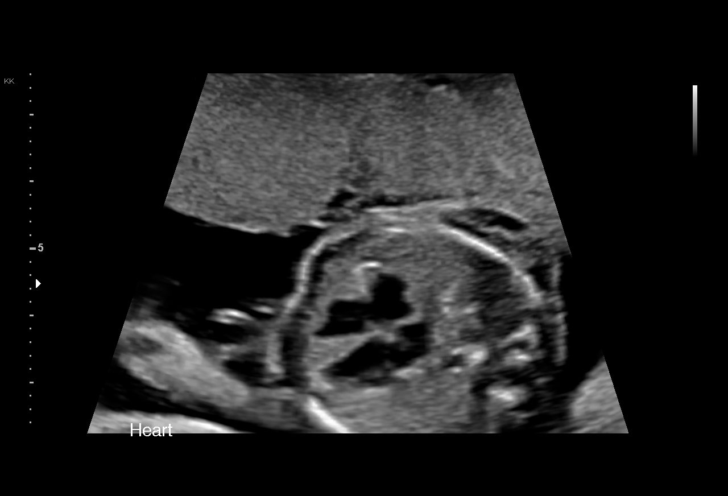
[im 46/113]
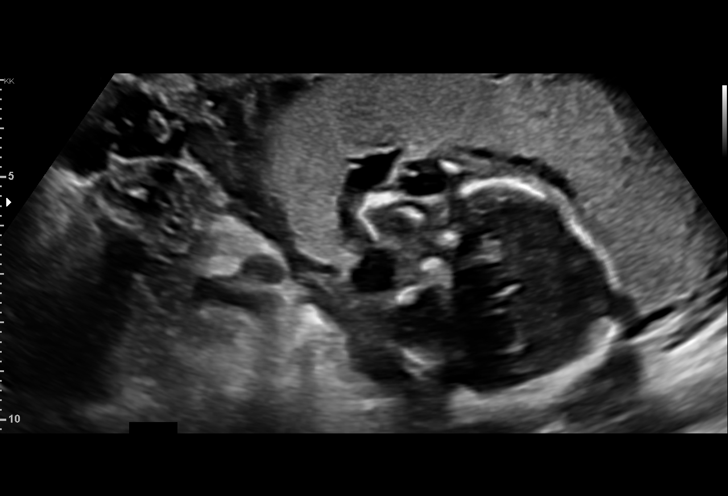
[im 54/113]
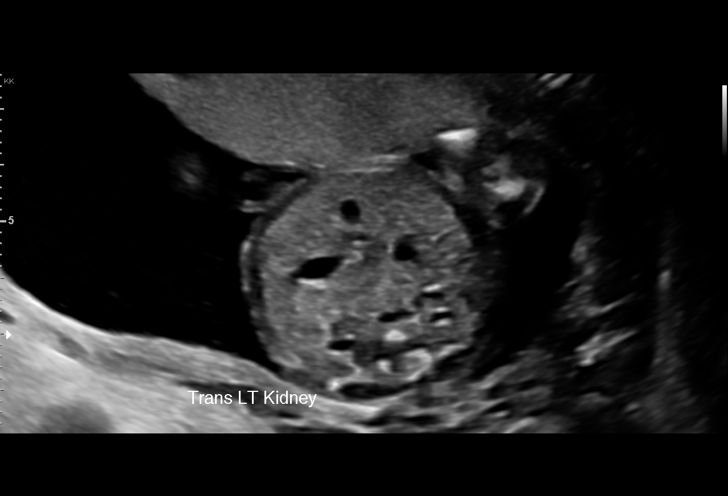
[im 63/113]
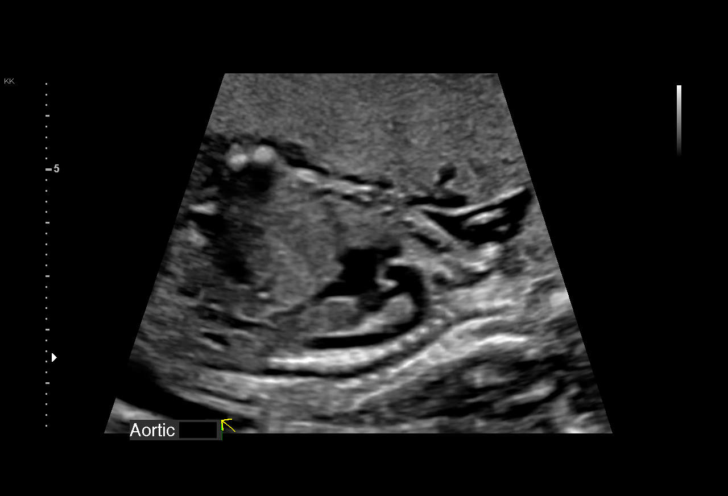
[im 71/113]
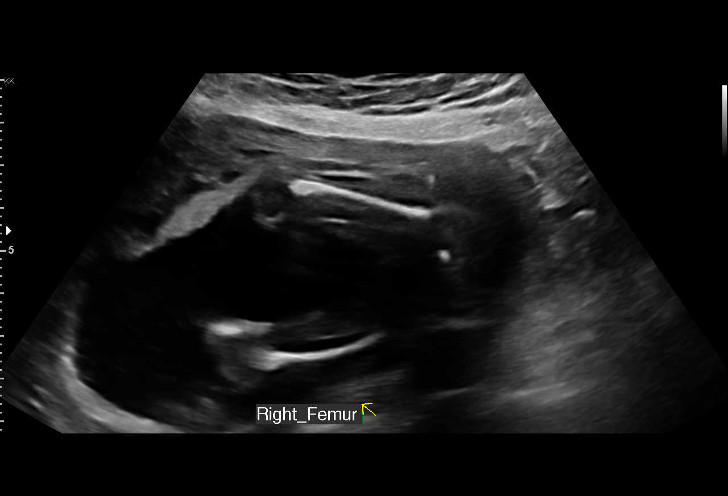
[im 79/113]
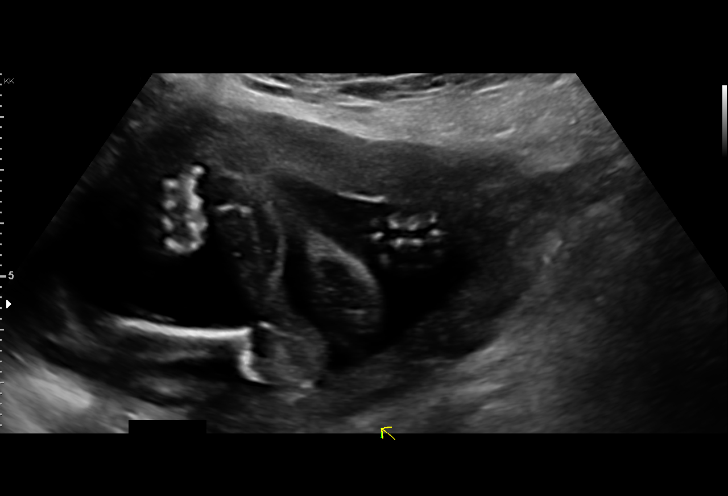
[im 88/113]
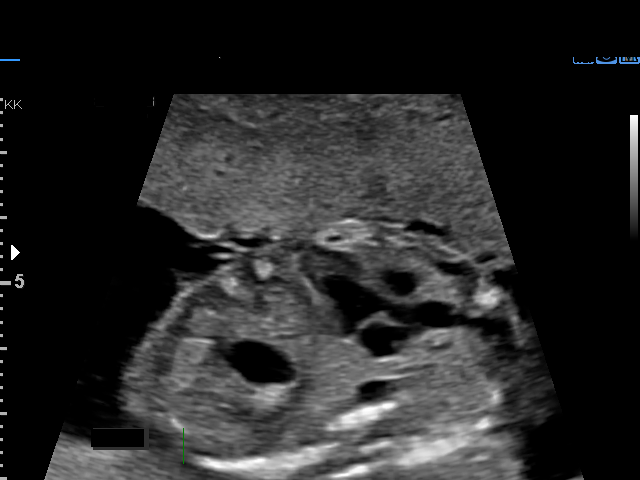
[im 96/113]
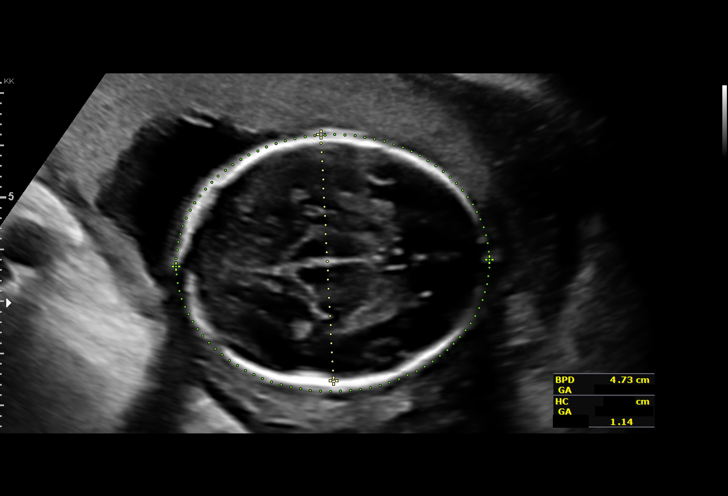
[im 104/113]
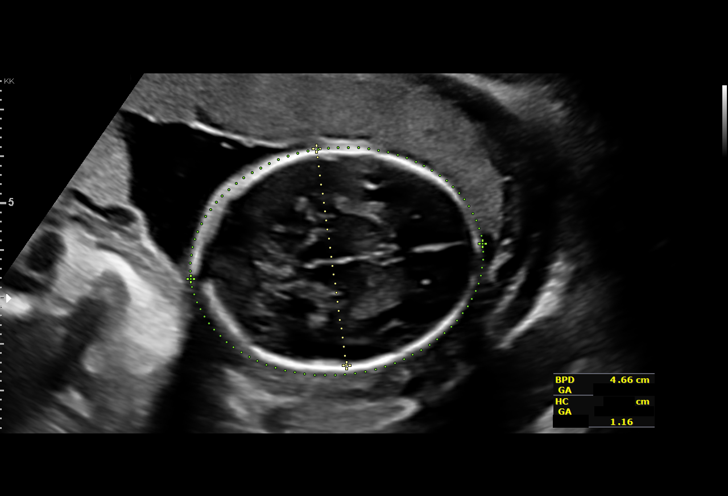
[im 113/113]
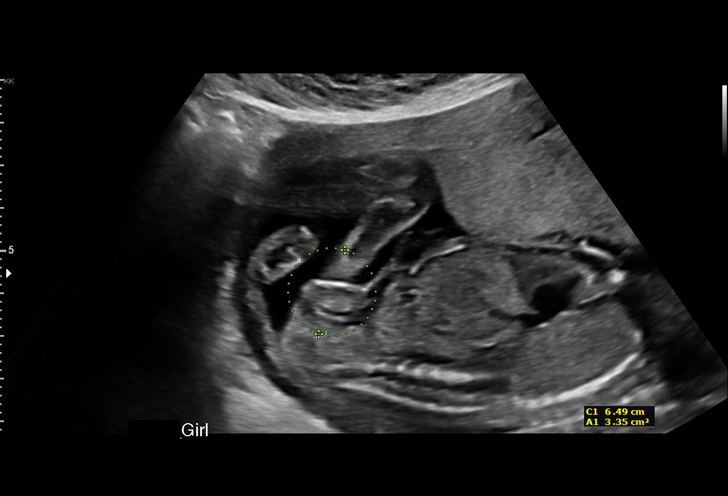

[14 of 28 positions shown; findings below may reference images not displayed]

1  CW RICH           [id number]      [id number]     [id number]
Indications

20 weeks gestation of pregnancy
Encounter for antenatal screening for
malformations
Encounter for uncertain dates
OB History

Blood Type:            Height:  5'4"   Weight (lb):  163      BMI:
Gravidity:    1
Fetal Evaluation

Num Of Fetuses:     1
Fetal Heart         151
Rate(bpm):
Cardiac Activity:   Observed
Presentation:       Cephalic
Placenta:           Anterior, above cervical os
P. Cord Insertion:  Visualized

Amniotic Fluid
AFI FV:      Subjectively within normal limits

Largest Pocket(cm)
4.6
Biometry

BPD:        47  mm     G. Age:  20w 1d         40  %    CI:        73.34   %   70 - 86
FL/HC:      20.2   %   16.8 -
HC:      174.4  mm     G. Age:  20w 0d         23  %    HC/AC:      1.17       1.09 -
AC:      148.5  mm     G. Age:  20w 1d         34  %    FL/BPD:     74.9   %
FL:       35.2  mm     G. Age:  21w 1d         66  %    FL/AC:      23.7   %   20 - 24
NFT:       3.3  mm

Est. FW:     358  gm    0 lb 13 oz      49  %
Gestational Age

LMP:           19w 2d       Date:   10/25/16                 EDD:   08/01/17
U/S Today:     20w 3d                                        EDD:   07/24/17
Best:          20w 3d    Det. By:   U/S (03/09/17)           EDD:   07/24/17
Anatomy

Cranium:               Appears normal         Aortic Arch:            Appears normal
Cavum:                 Appears normal         Ductal Arch:            Appears normal
Ventricles:            Appears normal         Diaphragm:              Appears normal
Choroid Plexus:        Appears normal         Stomach:                Appears normal, left
sided
Cerebellum:            Appears normal         Abdomen:                Appears normal
Posterior Fossa:       Appears normal         Abdominal Wall:         Appears nml (cord
insert, abd wall)
Nuchal Fold:           Appears normal         Cord Vessels:           Appears normal (3
vessel cord)
Face:                  Appears normal         Kidneys:                Appear normal
(orbits and profile)
Lips:                  Appears normal         Bladder:                Appears normal
Thoracic:              Appears normal         Spine:                  Not well visualized
Heart:                 Appears normal         Upper Extremities:      Appears normal
(4CH, axis, and situs
RVOT:                  Appears normal         Lower Extremities:      Appears normal
LVOT:                  Appears normal

Other:  Female gender. Heels and 5th digit visualized. Technically difficult
due to fetal position.
Cervix Uterus Adnexa

Cervix
Length:            3.4  cm.
Normal appearance by transabdominal scan.

Adnexa:       No abnormality visualized.
Impression

SIUP at 20+3 weeks
Normal detailed fetal anatomy; limited views of spine
Markers of aneuploidy: none
Normal amniotic fluid volume
EDC based on today's measurements: 07/24/17
Recommendations

Follow-up ultrasound in 4-6 weeks to complete anatomy
survey

## 2019-07-08 IMAGING — US US MFM OB FOLLOW-UP
1 series · 14 of 28 positions shown · non-contrast
Comparison: none

[Series 1: us mfm ob follow-up · 14 of 59 slices shown]
[im 3/59]
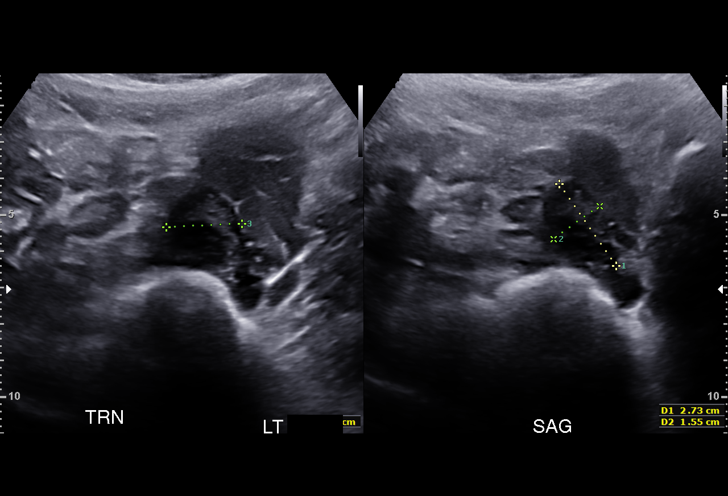
[im 7/59]
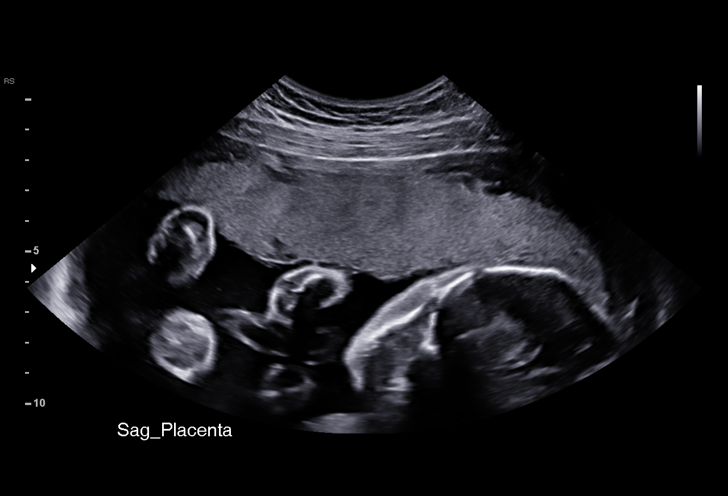
[im 11/59]
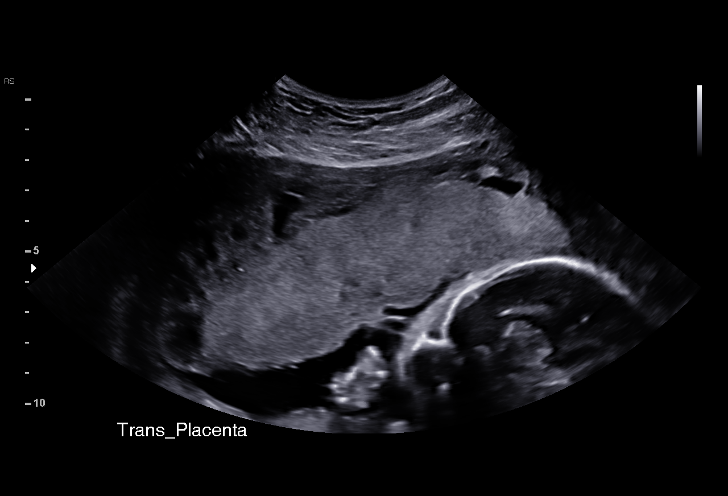
[im 16/59]
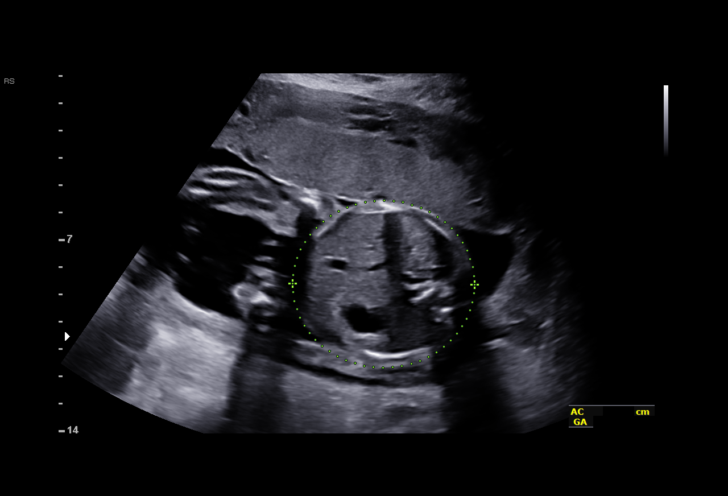
[im 20/59]
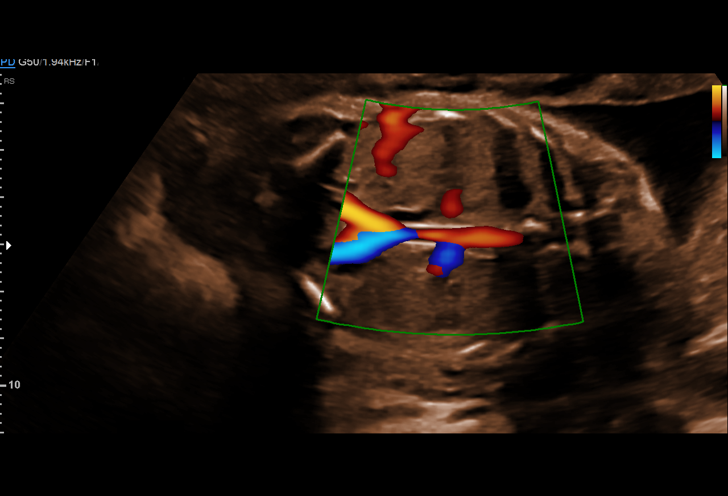
[im 24/59]
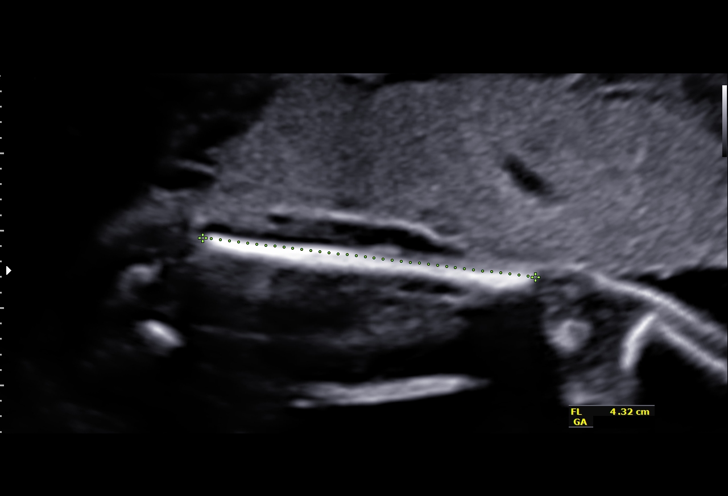
[im 28/59]
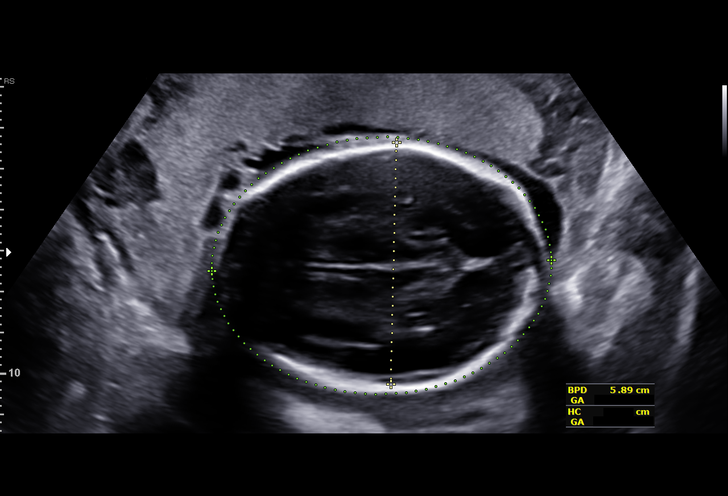
[im 33/59]
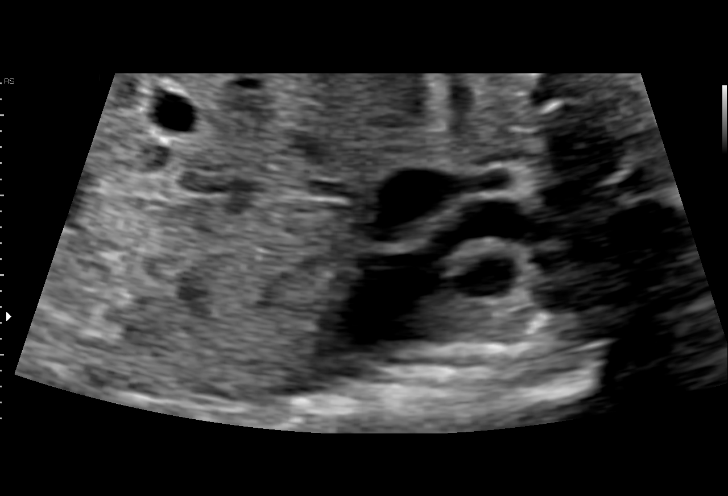
[im 37/59]
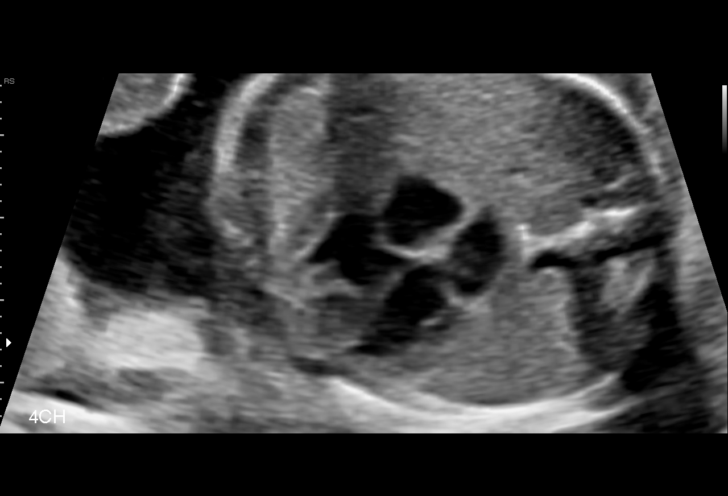
[im 41/59]
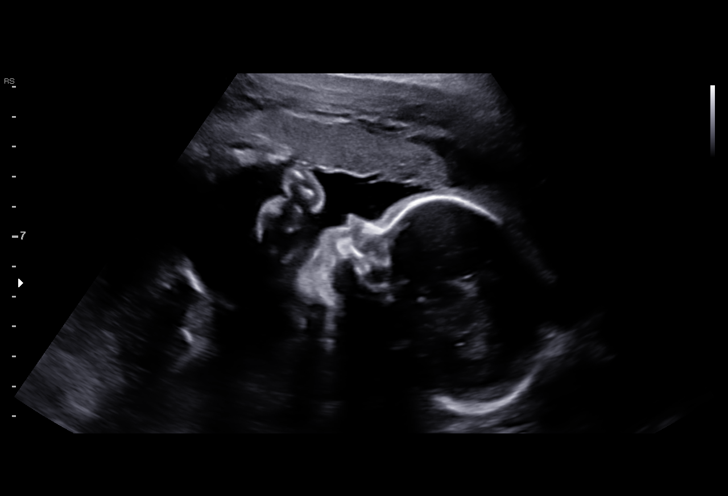
[im 46/59]
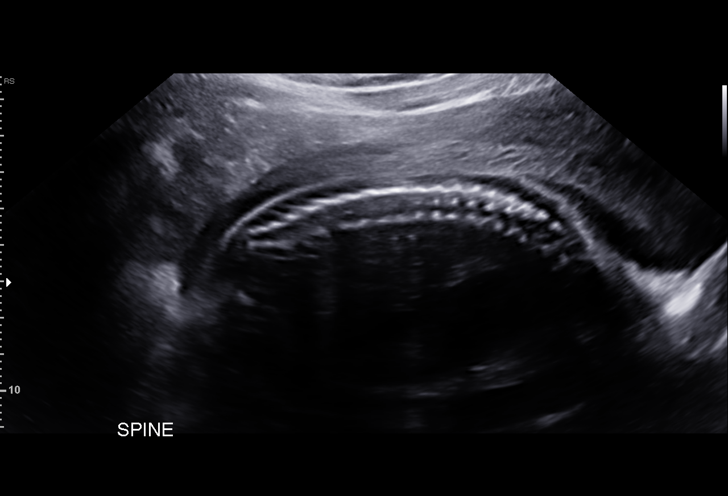
[im 50/59]
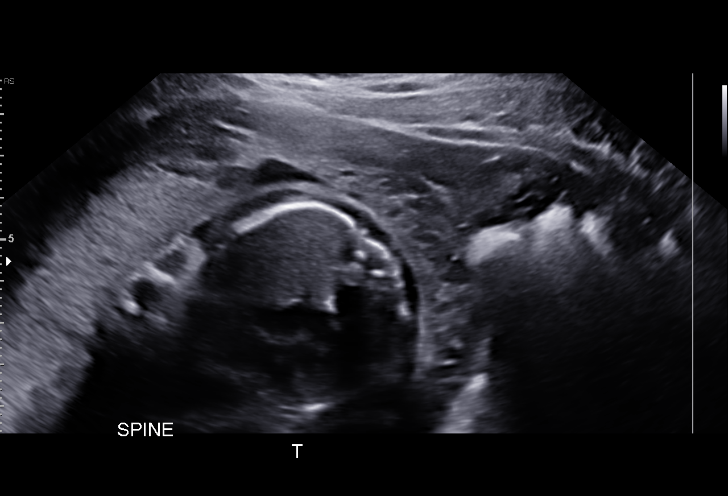
[im 54/59]
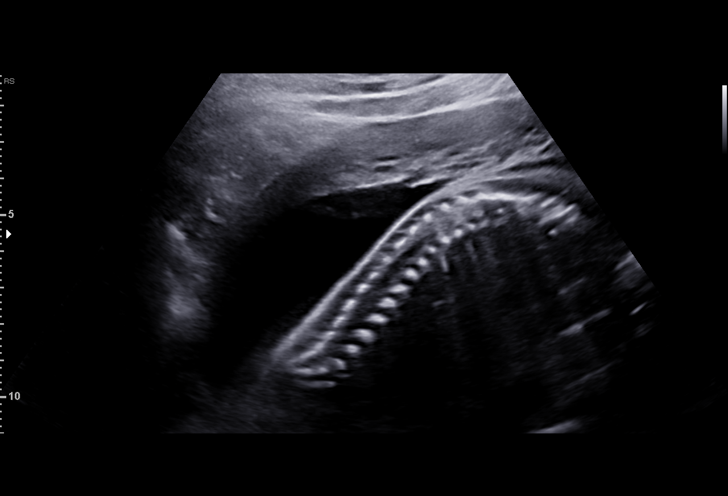
[im 59/59]
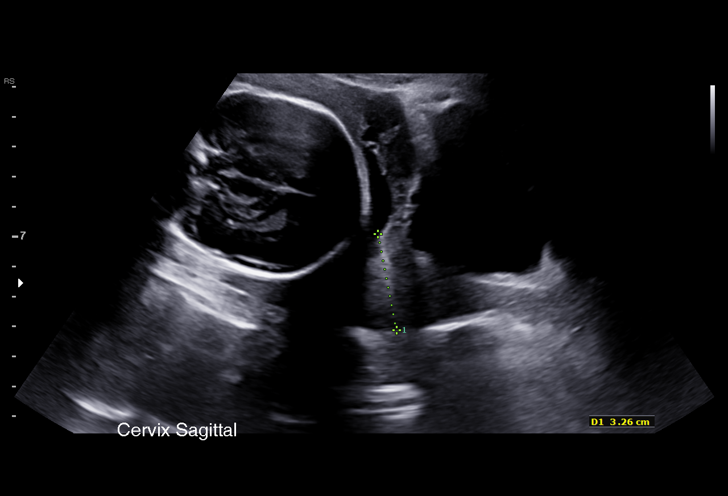

[14 of 28 positions shown; findings below may reference images not displayed]

1  LORRAINE JIM           [id number]      [id number]     [id number]
Indications

24 weeks gestation of pregnancy
Encounter for other antenatal screening
follow-up
Antenatal follow-up for nonvisualized fetal
anatomy
OB History

Blood Type:            Height:  5'4"   Weight (lb):  163       BMI:
Gravidity:    1
Fetal Evaluation

Num Of Fetuses:     1
Fetal Heart         145
Rate(bpm):
Cardiac Activity:   Observed
Presentation:       Cephalic
Placenta:           Anterior, above cervical os
P. Cord Insertion:  Previously Visualized

Amniotic Fluid
AFI FV:      Subjectively within normal limits

Largest Pocket(cm)
4.67
Biometry

BPD:      58.9  mm     G. Age:  24w 0d         25  %    CI:        67.68   %    70 - 86
FL/HC:      18.9   %    18.7 -
HC:      229.1  mm     G. Age:  24w 6d         44  %    HC/AC:      1.14        1.04 -
AC:      201.6  mm     G. Age:  24w 5d         48  %    FL/BPD:     73.7   %    71 - 87
FL:       43.4  mm     G. Age:  24w 2d         26  %    FL/AC:      21.5   %    20 - 24
HUM:      40.3  mm     G. Age:  24w 3d         41  %

Est. FW:     709  gm      1 lb 9 oz     52  %
Gestational Age

LMP:           23w 3d        Date:  10/25/16                 EDD:   08/01/17
U/S Today:     24w 3d                                        EDD:   07/25/17
Best:          24w 4d     Det. By:  U/S  (03/09/17)          EDD:   07/24/17
Anatomy

Cranium:               Appears normal         Aortic Arch:            Previously seen
Cavum:                 Previously seen        Ductal Arch:            Previously seen
Ventricles:            Appears normal         Diaphragm:              Previously seen
Choroid Plexus:        Previously seen        Stomach:                Appears normal, left
sided
Cerebellum:            Previously seen        Abdomen:                Appears normal
Posterior Fossa:       Previously seen        Abdominal Wall:         Previously seen
Nuchal Fold:           Not applicable (>20    Cord Vessels:           Previously seen
wks GA)
Face:                  Orbits and profile     Kidneys:                Appear normal
previously seen
Lips:                  Previously seen        Bladder:                Appears normal
Thoracic:              Appears normal         Spine:                  Ltd views no
intracranial signs of
NT
Heart:                 Appears normal         Upper Extremities:      Previously seen
(4CH, axis, and situs
RVOT:                  Previously seen        Lower Extremities:      Previously seen
LVOT:                  Appears normal

Other:  Female gender. Heels and 5th digit previously visualized. Technically
difficult due to fetal position.
Cervix Uterus Adnexa

Cervix
Length:           3.26  cm.
Normal appearance by transabdominal scan.

Uterus
No abnormality visualized.

Left Ovary
Within normal limits.

Right Ovary
Not visualized.

Adnexa:       No abnormality visualized. No adnexal mass
visualized.
Impression

SIUP at 24+4 weeks, here to complete anatomic survey
Normal fetal movement and cardiac activity
Interval review of fetal anatomy normal;  views of spine are
adequate today
Normal amniotic fluid volume
Growth is normal in the 52nd percentile
Recommendations

All relevant anatomy has been visualized. Follow-up
ultrasounds as clinically indicated.

## 2019-10-29 ENCOUNTER — Ambulatory Visit: Payer: Self-pay | Attending: Internal Medicine

## 2019-10-29 DIAGNOSIS — Z23 Encounter for immunization: Secondary | ICD-10-CM | POA: Insufficient documentation

## 2019-10-29 NOTE — Progress Notes (Signed)
   Covid-19 Vaccination Clinic  Name:  Wanda Gillespie    MRN: [id number] DOB: [birth year]  10/29/2019  Wanda Gillespie was observed post Covid-19 immunization for 15 minutes without incidence. She was provided with Vaccine Information Sheet and instruction to access the V-Safe system.   Wanda Gillespie was instructed to call 911 with any severe reactions post vaccine: Marland Kitchen Difficulty breathing  . Swelling of your face and throat  . A fast heartbeat  . A bad rash all over your body  . Dizziness and weakness    Immunizations Administered    Name Date Dose VIS Date Route   Pfizer COVID-19 Vaccine 10/29/2019  6:34 PM 0.3 mL 08/12/2019 Intramuscular   Manufacturer: [employer], [employer]   Lot: [id number]   NDC: [address]-9

## 2019-11-19 ENCOUNTER — Ambulatory Visit: Payer: Self-pay | Attending: Internal Medicine

## 2019-11-19 DIAGNOSIS — Z23 Encounter for immunization: Secondary | ICD-10-CM

## 2019-11-19 NOTE — Progress Notes (Signed)
   Covid-19 Vaccination Clinic  Name:  Wanda Gillespie    MRN: [id number] DOB: May 25, 1991  11/19/2019  Wanda Gillespie was observed post Covid-19 immunization for 15 minutes without incident. She was provided with Vaccine Information Sheet and instruction to access the V-Safe system.   Wanda Gillespie was instructed to call 911 with any severe reactions post vaccine: Marland Kitchen Difficulty breathing  . Swelling of face and throat  . A fast heartbeat  . A bad rash all over body  . Dizziness and weakness   Immunizations Administered    Name Date Dose VIS Date Route   Pfizer COVID-19 Vaccine 11/19/2019  4:52 PM 0.3 mL 08/12/2019 Intramuscular   Manufacturer: [employer], [employer]   Lot: [id number]   NDC: [address]-4

## 2019-11-21 ENCOUNTER — Ambulatory Visit: Payer: BC Managed Care – PPO | Attending: Internal Medicine

## 2019-11-21 DIAGNOSIS — Z20822 Contact with and (suspected) exposure to covid-19: Secondary | ICD-10-CM | POA: Insufficient documentation

## 2019-11-22 LAB — SARS-COV-2, NAA 2 DAY TAT

## 2019-11-22 LAB — NOVEL CORONAVIRUS, NAA: SARS-CoV-2, NAA: NOT DETECTED

## 2021-09-26 DIAGNOSIS — A5600 Chlamydial infection of lower genitourinary tract, unspecified: Secondary | ICD-10-CM | POA: Diagnosis not present

## 2021-12-14 DIAGNOSIS — R102 Pelvic and perineal pain: Secondary | ICD-10-CM | POA: Diagnosis not present

## 2021-12-14 DIAGNOSIS — R350 Frequency of micturition: Secondary | ICD-10-CM | POA: Diagnosis not present

## 2022-01-14 ENCOUNTER — Ambulatory Visit: Payer: Self-pay | Admitting: *Deleted

## 2022-01-14 ENCOUNTER — Encounter: Payer: Self-pay | Admitting: Radiology

## 2022-01-14 NOTE — Telephone Encounter (Signed)
?  Summary: Nurse call back request  ? Pt called requesting to speak to a female nurse, did not disclose any further details when asked    ? ?[phone]   ?  ? ? ? ?Chief Complaint: vaginal discharge, appt with GYN cancelled due to patient was not able to get to appt 30 minutes prior ?Symptoms: vaginal discharge white thick. No odor upper abdominal pain crampy and dull. ?Frequency: 4 days after period stopped  ?Pertinent Negatives: Patient denies fever ?Disposition: [x] ED /[] Urgent Care (no appt availability in office) / [] Appointment(In office/virtual)/ []  [facility]/ [] Home Care/ [] Refused Recommended Disposition /[] [facility]/ []  Follow-up with PCP ?Additional Notes:  ? ?Patient reports scheduling appt due to frequent BV. Appt today cancelled due to patient arriving at 4 pm and not 30 minutes early per patient. 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 No PCP recommended UC/ED if OTC medications do  not work for [employer].  ? ? ?Reason for Disposition ? [1] Symptoms of a "yeast infection" (i.e., itchy, white discharge, not bad smelling) AND [2] not improved > 3 days following CARE ADVICE ? ?Answer Assessment - Initial Assessment Questions ?1. DISCHARGE: "Describe the discharge." (e.g., white, yellow, green, gray, foamy, cottage cheese-like) ?    White thick discharge ?2. ODOR: "Is there a bad odor?" ?    no ?3. ONSET: "When did the discharge begin?" ?    4 days after period stopped  ?4. RASH: "Is there a rash in that area?" If Yes, ask: "Describe it." (e.g., redness, blisters, sores, bumps) ?    no ?5. ABDOMINAL PAIN: "Are you having any abdominal pain?" If Yes, ask: "What does it feel like? " (e.g., crampy, dull, intermittent, constant)  ?    Upper abdominal pain cramping and dull at times  ?6. ABDOMINAL PAIN SEVERITY: If present, ask: "How bad is it?"  (e.g., mild, moderate, severe) ? - MILD - doesn't interfere with normal activities  ? - MODERATE -  interferes with normal activities or awakens from sleep  ? - SEVERE - patient doesn't want to move (R/O peritonitis)  ?    Mild able to teach at school ?7. CAUSE: "What do you think is causing the discharge?" "Have you had the same problem before? What happened then?" ?    Not sure  ?8. OTHER SYMPTOMS: "Do you have any other symptoms?" (e.g., fever, itching, vaginal bleeding, pain with urination, injury to genital area, vaginal foreign body) ?    Vaginal discharge  ?9. PREGNANCY: "Is there any chance you are pregnant?" "When was your last menstrual period?" ?    na ? ?Protocols used: Vaginal Discharge-A-AH ? ?

## 2022-02-10 ENCOUNTER — Ambulatory Visit (INDEPENDENT_AMBULATORY_CARE_PROVIDER_SITE_OTHER): Payer: BC Managed Care – PPO | Admitting: Family Medicine

## 2022-02-10 ENCOUNTER — Encounter: Payer: Self-pay | Admitting: Family Medicine

## 2022-02-10 VITALS — BP 118/72 | HR 90 | Wt 165.4 lb

## 2022-02-10 DIAGNOSIS — Z0001 Encounter for general adult medical examination with abnormal findings: Secondary | ICD-10-CM | POA: Diagnosis not present

## 2022-02-10 DIAGNOSIS — N760 Acute vaginitis: Secondary | ICD-10-CM

## 2022-02-10 DIAGNOSIS — E559 Vitamin D deficiency, unspecified: Secondary | ICD-10-CM | POA: Diagnosis not present

## 2022-02-10 DIAGNOSIS — R12 Heartburn: Secondary | ICD-10-CM | POA: Diagnosis not present

## 2022-02-10 DIAGNOSIS — R7301 Impaired fasting glucose: Secondary | ICD-10-CM

## 2022-02-10 DIAGNOSIS — B9689 Other specified bacterial agents as the cause of diseases classified elsewhere: Secondary | ICD-10-CM

## 2022-02-10 MED ORDER — OMEPRAZOLE 20 MG PO CPDR: 20.0000 mg | DELAYED_RELEASE_CAPSULE | Freq: Every day | ORAL | 3 refills | Status: AC

## 2022-02-10 NOTE — Assessment & Plan Note (Signed)
-  symptoms indicative of heartburn -will start PPI therapy

## 2022-02-10 NOTE — Patient Instructions (Addendum)
I appreciate the opportunity to provide care to you today!    Follow up:  3 months  Labs: please stop by the lab today to get your blood drawn (CBC, CMP, TSH, Lipid profile, HgA1c, Vit D)   -Please pick up your medications at the pharmacy  BV risk factors using certain feminine hygiene products, such as vaginal deodorants and douches using a perfumed bubble bath using some scented soaps bathing in water that contains antiseptic liquids washing underwear with a strong detergent having sex with a new partner having multiple sex partners smoking   BV prevention using a barrier method of protection, such as a condom, during sex avoiding douching avoiding perfumed bubble baths not using scented soaps or vaginal deodorants washing underwear in gentle detergents   Please continue to a heart-healthy diet and increase your physical activities. Try to exercise for at least three times a week.      It was a pleasure to see you and I look forward to continuing to work together on your health and well-being. Please do not hesitate to call the office if you need care or have questions about your care.   Have a wonderful day and week. With Gratitude, Gilmore Laroche MSN, FNP-BC

## 2022-02-10 NOTE — Assessment & Plan Note (Addendum)
no symptoms of BV were reported today patient is following up with her OBGYN on  02/12/22  Educated on BV prevention encouraged to use a barrier method of protection, such as a condom, during sex encouraged to avoid douching encouraged to avoid perfumed bubble baths encouraged not to use scented soaps or vaginal deodorants encouraged to wash underwear in gentle detergents

## 2022-02-10 NOTE — Progress Notes (Signed)
New Patient Office Visit  Subjective:  Patient ID: Wanda Gillespie, female    DOB: [birth year]  Age: 31 y.o. MRN: [id number]  CC:  Chief Complaint  Patient presents with   New Patient (Initial Visit)    Pt establishing care, pt complains of pain under sternum onset 2 weeks ago. Pt complains of vaginal concerns, has an appt set with obgyn for Wednesday.     HPI Wanda Gillespie is a 31 y.o. female who presents for establishing care. Heartburn: onset of symptoms 2 weeks ago. she c/o of a burning and aching sensation around her sternum that radiates to her throat. Sensation is intermittent. No cardiac or muscleskealtal symptoms reported as indicated in the ROS.  Recurrent BV: she reported being treated for chlamydia in December 2022; since then, she has had recurrent BV. She is still sexually active with no current c/o of BV today. She notes having symptoms 3-4 days after intercourse. She reports having excessive discharge.    History reviewed. No pertinent past medical history.  Past Surgical History:  Procedure Laterality Date   DILATION AND CURETTAGE OF UTERUS      Family History  Problem Relation Age of Onset   Heart disease Maternal Grandmother    Heart disease Paternal Grandmother     Social History   Socioeconomic History   Marital status: Single    Spouse name: Not on file   Number of children: Not on file   Years of education: Not on file   Highest education level: Not on file  Occupational History   Not on file  Tobacco Use   Smoking status: Never   Smokeless tobacco: Never  Substance and Sexual Activity   Alcohol use: No   Drug use: No   Sexual activity: Not Currently    Birth control/protection: None  Other Topics Concern   Not on file  Social History Narrative   Not on file   Social Determinants of Health   Financial Resource Strain: Not on file  Food Insecurity: Not on file  Transportation Needs: Not on file  Physical Activity: Not on file  Stress:  Not on file  Social Connections: Not on file  Intimate Partner Violence: Not on file    ROS Review of Systems  Constitutional:  Negative for chills, fatigue and fever.  HENT:  Negative for congestion, sinus pressure and sinus pain.   Eyes:  Negative for photophobia, pain and redness.  Respiratory:  Negative for apnea, cough, chest tightness, shortness of breath and wheezing.   Cardiovascular:  Negative for chest pain, palpitations and leg swelling.  Endocrine: Negative for polydipsia, polyphagia and polyuria.  Genitourinary:  Negative for dysuria, flank pain, frequency, genital sores, pelvic pain, urgency and vaginal discharge.  Musculoskeletal:  Negative for neck pain.  Skin:  Negative for rash.  Neurological:  Negative for dizziness and numbness.  Hematological:  Does not bruise/bleed easily.  Psychiatric/Behavioral:  Negative for self-injury and suicidal ideas.     Objective:   Today's Vitals: BP 118/72   Pulse 90   Wt 165 lb 6.4 oz (75 kg)   LMP 01/17/2022   SpO2 99%   Breastfeeding No   BMI 28.39 kg/m   Physical Exam HENT:     Head: Normocephalic.     Right Ear: External ear normal.     Left Ear: External ear normal.     Nose: No congestion or rhinorrhea.     Mouth/Throat:     Mouth: Mucous membranes are moist.  Eyes:     Extraocular Movements: Extraocular movements intact.     Pupils: Pupils are equal, round, and reactive to light.  Cardiovascular:     Rate and Rhythm: Regular rhythm.     Pulses: Normal pulses.     Heart sounds: Normal heart sounds.  Pulmonary:     Effort: Pulmonary effort is normal.     Breath sounds: Normal breath sounds.  Abdominal:     Palpations: Abdomen is soft.  Musculoskeletal:     Right lower leg: No edema.     Left lower leg: No edema.  Skin:    Findings: No lesion.  Neurological:     Mental Status: She is alert and oriented to person, place, and time.  Psychiatric:     Comments: Normal affect     Assessment & Plan:    Problem List Items Addressed This Visit       Genitourinary   BV (bacterial vaginosis)    no symptoms of BV were reported today patient is following up with her OBGYN on  02/12/22  Educated on BV prevention encouraged to use a barrier method of protection, such as a condom, during sex encouraged to avoid douching encouraged to avoid perfumed bubble baths encouraged not to use scented soaps or vaginal deodorants encouraged to wash underwear in gentle detergents        Other   Heartburn - Primary    -symptoms indicative of heartburn -will start PPI therapy      Relevant Medications   omeprazole (PRILOSEC) 20 MG capsule   Other Visit Diagnoses     Vitamin D deficiency       Relevant Orders   Vitamin D (25 hydroxy)   IFG (impaired fasting glucose)       Relevant Orders   Hemoglobin A1C   Encounter for general adult medical examination with abnormal findings       Relevant Orders   CBC with Differential/Platelet   CMP14+EGFR   TSH + free T4   Lipid panel       Outpatient Encounter Medications as of 02/10/2022  Medication Sig   omeprazole (PRILOSEC) 20 MG capsule Take 1 capsule (20 mg total) by mouth daily.   ibuprofen (ADVIL,MOTRIN) 600 MG tablet Take 1 tablet (600 mg total) by mouth every 6 (six) hours. (Patient not taking: Reported on 08/26/2017)   norethindrone (MICRONOR,CAMILA,ERRIN) 0.35 MG tablet Take 1 tablet (0.35 mg total) by mouth daily. (Patient not taking: Reported on 02/10/2022)   Prenatal MV-Min-Fe Cbn-FA-DHA (PRENATAL PLUS DHA) 7-0.4-100 MG TABS Take 1 tablet by mouth daily.   No facility-administered encounter medications on file as of 02/10/2022.    Follow-up: Return in about 3 months (around 05/13/2022).   Alvira Monday, FNP

## 2022-02-11 DIAGNOSIS — R7301 Impaired fasting glucose: Secondary | ICD-10-CM | POA: Diagnosis not present

## 2022-02-11 DIAGNOSIS — E559 Vitamin D deficiency, unspecified: Secondary | ICD-10-CM | POA: Diagnosis not present

## 2022-02-11 DIAGNOSIS — Z0001 Encounter for general adult medical examination with abnormal findings: Secondary | ICD-10-CM | POA: Diagnosis not present

## 2022-02-11 NOTE — Progress Notes (Unsigned)
   Kanoelani Dobies 1991/04/07 [id number]   History:  31 y.o. G1P1001 presents as new patient. Monthly cycles. Normal pap history. + chlamydia 08/2021.  Gynecologic History Patient's last menstrual period was 01/17/2022.   Contraception/Family planning: oral progesterone-only contraceptive Sexually active: ***  Health Maintenance Last Pap: 02/05/2017. Results were: Normal Last mammogram: Not indicated Last colonoscopy: Not indicated Last Dexa: Not indicated  Past medical history, past surgical history, family history and social history were all reviewed and documented in the EPIC chart.  ROS:  A ROS was performed and pertinent positives and negatives are included.  Exam:  There were no vitals filed for this visit. There is no height or weight on file to calculate BMI.  General appearance:  Normal Thyroid:  Symmetrical, normal in size, without palpable masses or nodularity. Respiratory  Auscultation:  Clear without wheezing or rhonchi Cardiovascular  Auscultation:  Regular rate, without rubs, murmurs or gallops  Edema/varicosities:  Not grossly evident Abdominal  Soft,nontender, without masses, guarding or rebound.  Liver/spleen:  No organomegaly noted  Hernia:  None appreciated  Skin  Inspection:  Grossly normal Breasts: Examined lying and sitting.   Right: Without masses, retractions, nipple discharge or axillary adenopathy.   Left: Without masses, retractions, nipple discharge or axillary adenopathy. Genitourinary   Inguinal/mons:  Normal without inguinal adenopathy  External genitalia:  Normal appearing vulva with no masses, tenderness, or lesions  BUS/Urethra/Skene's glands:  Normal  Vagina:  Normal appearing with normal color and discharge, no lesions  Cervix:  Normal appearing without discharge or lesions  Uterus:  Normal in size, shape and contour.  Midline and mobile, nontender  Adnexa/parametria:     Rt: Normal in size, without masses or tenderness.   Lt: Normal  in size, without masses or tenderness.  Anus and perineum: Normal  Digital rectal exam: Not indicated  Patient informed chaperone available to be present for breast and pelvic exam. Patient has requested no chaperone to be present. Patient has been advised what will be completed during breast and pelvic exam.   Assessment/Plan:  31 y.o. G1P1001 for annual exam.    Return in 1 year for annual.   Olivia Mackie DNP, 11:14 AM 02/11/2022

## 2022-02-12 ENCOUNTER — Encounter: Payer: Self-pay | Admitting: Nurse Practitioner

## 2022-02-12 ENCOUNTER — Ambulatory Visit (INDEPENDENT_AMBULATORY_CARE_PROVIDER_SITE_OTHER): Payer: BC Managed Care – PPO | Admitting: Nurse Practitioner

## 2022-02-12 ENCOUNTER — Other Ambulatory Visit (HOSPITAL_COMMUNITY)
Admission: RE | Admit: 2022-02-12 | Discharge: 2022-02-12 | Disposition: A | Discharge status: Home / Self Care | Payer: BC Managed Care – PPO | Source: Ambulatory Visit | Attending: Nurse Practitioner | Admitting: Nurse Practitioner

## 2022-02-12 ENCOUNTER — Other Ambulatory Visit: Payer: Self-pay | Admitting: Family Medicine

## 2022-02-12 VITALS — BP 118/74 | Ht 64.0 in | Wt 167.0 lb

## 2022-02-12 DIAGNOSIS — Z3041 Encounter for surveillance of contraceptive pills: Secondary | ICD-10-CM

## 2022-02-12 DIAGNOSIS — Z01419 Encounter for gynecological examination (general) (routine) without abnormal findings: Secondary | ICD-10-CM | POA: Insufficient documentation

## 2022-02-12 DIAGNOSIS — N760 Acute vaginitis: Secondary | ICD-10-CM

## 2022-02-12 DIAGNOSIS — N898 Other specified noninflammatory disorders of vagina: Secondary | ICD-10-CM

## 2022-02-12 DIAGNOSIS — B9689 Other specified bacterial agents as the cause of diseases classified elsewhere: Secondary | ICD-10-CM

## 2022-02-12 DIAGNOSIS — R634 Abnormal weight loss: Secondary | ICD-10-CM | POA: Diagnosis not present

## 2022-02-12 DIAGNOSIS — E559 Vitamin D deficiency, unspecified: Secondary | ICD-10-CM

## 2022-02-12 LAB — LIPID PANEL
Chol/HDL Ratio: 3.2 ratio (ref 0.0–4.4)
Cholesterol, Total: 141 mg/dL (ref 100–199)
HDL: 44 mg/dL (ref >39)
LDL Chol Calc (NIH): 79 mg/dL (ref 0–99)
Triglycerides: 93 mg/dL (ref 0–149)
VLDL Cholesterol Cal: 18 mg/dL (ref 5–40)

## 2022-02-12 LAB — CBC WITH DIFFERENTIAL/PLATELET
Basophils Absolute: 0 10*3/uL (ref 0.0–0.2)
Basos: 0 %
EOS (ABSOLUTE): 0.1 10*3/uL (ref 0.0–0.4)
Eos: 3 %
Hematocrit: 42 % (ref 34.0–46.6)
Hemoglobin: 14.1 g/dL (ref 11.1–15.9)
Immature Grans (Abs): 0 10*3/uL (ref 0.0–0.1)
Immature Granulocytes: 0 %
Lymphocytes Absolute: 2.3 10*3/uL (ref 0.7–3.1)
Lymphs: 42 %
MCH: 30.9 pg (ref 26.6–33.0)
MCHC: 33.6 g/dL (ref 31.5–35.7)
MCV: 92 fL (ref 79–97)
Monocytes Absolute: 0.5 10*3/uL (ref 0.1–0.9)
Monocytes: 8 %
Neutrophils Absolute: 2.6 10*3/uL (ref 1.4–7.0)
Neutrophils: 47 %
Platelets: 164 10*3/uL (ref 150–450)
RBC: 4.57 x10E6/uL (ref 3.77–5.28)
RDW: 12.4 % (ref 11.7–15.4)
WBC: 5.6 10*3/uL (ref 3.4–10.8)

## 2022-02-12 LAB — VITAMIN D 25 HYDROXY (VIT D DEFICIENCY, FRACTURES): Vit D, 25-Hydroxy: 18.2 ng/mL — ABNORMAL LOW (ref 30.0–100.0)

## 2022-02-12 LAB — CMP14+EGFR
ALT: 15 IU/L (ref 0–32)
AST: 15 IU/L (ref 0–40)
Albumin/Globulin Ratio: 1.7 (ref 1.2–2.2)
Albumin: 3.9 g/dL (ref 3.9–5.0)
Alkaline Phosphatase: 37 IU/L — ABNORMAL LOW (ref 44–121)
BUN/Creatinine Ratio: 11 (ref 9–23)
BUN: 10 mg/dL (ref 6–20)
Bilirubin Total: <0.2 mg/dL (ref 0.0–1.2)
CO2: 25 mmol/L (ref 20–29)
Calcium: 8.9 mg/dL (ref 8.7–10.2)
Chloride: 105 mmol/L (ref 96–106)
Creatinine, Ser: 0.89 mg/dL (ref 0.57–1.00)
Globulin, Total: 2.3 g/dL (ref 1.5–4.5)
Glucose: 72 mg/dL (ref 70–99)
Potassium: 5 mmol/L (ref 3.5–5.2)
Sodium: 141 mmol/L (ref 134–144)
Total Protein: 6.2 g/dL (ref 6.0–8.5)
eGFR: 91 mL/min/{1.73_m2} (ref >59)

## 2022-02-12 LAB — WET PREP FOR TRICH, YEAST, CLUE

## 2022-02-12 LAB — HEMOGLOBIN A1C
Est. average glucose Bld gHb Est-mCnc: 103 mg/dL
Hgb A1c MFr Bld: 5.2 % (ref 4.8–5.6)

## 2022-02-12 LAB — TSH+FREE T4
Free T4: 1.28 ng/dL (ref 0.82–1.77)
TSH: 1.74 u[IU]/mL (ref 0.450–4.500)

## 2022-02-12 MED ORDER — VITAMIN D (ERGOCALCIFEROL) 1.25 MG (50000 UNIT) PO CAPS: 50000.0000 [IU] | ORAL_CAPSULE | ORAL | 1 refills | Status: AC

## 2022-02-12 MED ORDER — METRONIDAZOLE 0.75 % VA GEL: 1.0000 | VAGINAL | 1 refills | Status: AC | PRN

## 2022-02-12 MED ORDER — METRONIDAZOLE 500 MG PO TABS: 500.0000 mg | ORAL_TABLET | Freq: Two times a day (BID) | ORAL | 0 refills | Status: DC

## 2022-02-12 NOTE — Progress Notes (Unsigned)
er

## 2022-02-12 NOTE — Progress Notes (Signed)
Please inform the patient to start taking once weekly Vit.D supplement. The prescription is sent to her pharmacy.

## 2022-02-14 LAB — CYTOLOGY - PAP
Adequacy: ABSENT
Diagnosis: NEGATIVE
Diagnosis: REACTIVE

## 2022-03-05 ENCOUNTER — Encounter: Payer: Self-pay | Admitting: Nurse Practitioner

## 2022-03-05 ENCOUNTER — Ambulatory Visit (INDEPENDENT_AMBULATORY_CARE_PROVIDER_SITE_OTHER): Payer: BC Managed Care – PPO | Admitting: Nurse Practitioner

## 2022-03-05 VITALS — BP 110/70 | HR 82

## 2022-03-05 DIAGNOSIS — Z113 Encounter for screening for infections with a predominantly sexual mode of transmission: Secondary | ICD-10-CM | POA: Diagnosis not present

## 2022-03-05 DIAGNOSIS — N898 Other specified noninflammatory disorders of vagina: Secondary | ICD-10-CM

## 2022-03-05 LAB — WET PREP FOR TRICH, YEAST, CLUE

## 2022-03-05 NOTE — Progress Notes (Signed)
   Acute Office Visit  Subjective:    Patient ID: Wanda Gillespie, female    DOB: 07/31/1991, 31 y.o.   MRN: [id number]   HPI 31 y.o. presents today for abdominal pain and white clumpy discharge that is sometimes gray. Pain is crampy in nature. She is unsure if discharge is from medication. Denies urinary symptoms. H/O recurrent BV s/t intercourse. Uses Metrogel as needed with intercourse. Treated for BV 02/12/2022, completed full course of Flagyl. Felt symptoms improved but did not fully go away. Partner treated for Chlamydia in December, she was treated by urgent care prophylactically at that time.    Review of Systems  Constitutional: Negative.   Gastrointestinal:  Positive for abdominal pain.  Genitourinary:  Positive for vaginal discharge. Negative for dysuria, frequency, hematuria, urgency and vaginal pain.       Objective:    Physical Exam Constitutional:      Appearance: Normal appearance.  Genitourinary:    General: Normal vulva.     Vagina: Vaginal discharge present. No erythema.     Cervix: Normal.     BP 110/70   Pulse 82   LMP 02/12/2022   SpO2 99%  Wt Readings from Last 3 Encounters:  02/12/22 167 lb (75.8 kg)  02/10/22 165 lb 6.4 oz (75 kg)  08/26/17 182 lb (82.6 kg)        Patient informed chaperone available to be present for breast and/or pelvic exam. Patient has requested no chaperone to be present. Patient has been advised what will be completed during breast and pelvic exam.   Wet prep negative yeast, BV, trich. Moderate wbcs, many bacteria  Assessment & Plan:   Problem List Items Addressed This Visit   None Visit Diagnoses     Vaginal discharge    -  Primary   Relevant Orders   C. trachomatis/N. gonorrhoeae RNA   WET PREP FOR TRICH, YEAST, CLUE   Screening examination for STD (sexually transmitted disease)       Relevant Orders   C. trachomatis/N. gonorrhoeae RNA   RPR   HIV Antibody (routine testing w rflx)      Plan: Negative wet  prep but many bacteria and moderate WBCs present. STD panel pending. If negative, Boric acid suppositories twice weekly recommended along with continued use of Metrogel as needed with intercourse.      Olivia Mackie DNP, 8:31 AM 03/05/2022

## 2022-03-06 ENCOUNTER — Encounter: Payer: Self-pay | Admitting: Nurse Practitioner

## 2022-03-06 ENCOUNTER — Other Ambulatory Visit: Payer: Self-pay

## 2022-03-06 DIAGNOSIS — R12 Heartburn: Secondary | ICD-10-CM

## 2022-03-06 LAB — RPR: RPR Ser Ql: NONREACTIVE

## 2022-03-06 LAB — HIV ANTIBODY (ROUTINE TESTING W REFLEX): HIV 1&2 Ab, 4th Generation: NONREACTIVE

## 2022-03-06 LAB — C. TRACHOMATIS/N. GONORRHOEAE RNA
C. trachomatis RNA, TMA: NOT DETECTED
N. gonorrhoeae RNA, TMA: NOT DETECTED

## 2022-03-06 NOTE — Telephone Encounter (Signed)
FYI.  Rx request for omeprazole received via fax. However, Rx is being prescribed by PCP. Will refuse and provide PCPs fax number to pharmacy.

## 2022-03-11 ENCOUNTER — Ambulatory Visit: Payer: BC Managed Care – PPO | Admitting: Nurse Practitioner

## 2022-03-18 ENCOUNTER — Telehealth: Payer: Self-pay | Admitting: Family Medicine

## 2022-03-18 NOTE — Telephone Encounter (Signed)
Faxed form back declining p Gloria.

## 2022-03-18 NOTE — Telephone Encounter (Signed)
Deanna Artis with [facility] called stating pt is wanting to know if she can get a 90-day supply of Vitamin D, Ergocalciferol, (DRISDOL) 1.25 MG (50000 UNIT) CAPS capsule.Can you please refill in a 90-day qty?      Call back # [phone]  Fax # [phone]

## 2022-05-13 ENCOUNTER — Ambulatory Visit: Payer: BC Managed Care – PPO | Admitting: Family Medicine

## 2022-06-04 ENCOUNTER — Ambulatory Visit: Payer: BC Managed Care – PPO | Admitting: Family Medicine

## 2023-02-16 ENCOUNTER — Ambulatory Visit: Payer: BC Managed Care – PPO | Admitting: Nurse Practitioner

## 2023-02-16 NOTE — Progress Notes (Deleted)
   Wanda Gillespie [birth year] [id number]   History:  32 y.o. G1P1001 presents as new patient. Monthly cycles. Normal pap history. + chlamydia 08/2021. Reports H/O recurrent BV that occurs after intercourse.   Gynecologic History No LMP recorded.   Contraception/Family planning: none Sexually active: ***  Health Maintenance Last Pap: 02/12/2022. Results were: Normal Last mammogram: Not indicated Last colonoscopy: Not indicated Last Dexa: Not indicated  Past medical history, past surgical history, family history and social history were all reviewed and documented in the EPIC chart. 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 30 yo daughter.   ROS:  A ROS was performed and pertinent positives and negatives are included.  Exam:  There were no vitals filed for this visit.  There is no height or weight on file to calculate BMI.  General appearance:  Normal Thyroid:  Symmetrical, normal in size, without palpable masses or nodularity. Respiratory  Auscultation:  Clear without wheezing or rhonchi Cardiovascular  Auscultation:  Regular rate, without rubs, murmurs or gallops  Edema/varicosities:  Not grossly evident Abdominal  Soft,nontender, without masses, guarding or rebound.  Liver/spleen:  No organomegaly noted  Hernia:  None appreciated  Skin  Inspection:  Grossly normal Breasts: Examined lying and sitting.   Right: Without masses, retractions, nipple discharge or axillary adenopathy.   Left: Without masses, retractions, nipple discharge or axillary adenopathy. Genitourinary   Inguinal/mons:  Normal without inguinal adenopathy  External genitalia:  Discoloration from mons to anus  BUS/Urethra/Skene's glands:  Normal  Vagina:  Normal appearing with normal color and discharge, no lesions  Cervix:  Normal appearing without discharge or lesions  Uterus:  Normal in size, shape and contour.  Midline and mobile, nontender  Adnexa/parametria:     Rt: Normal in size, without  masses or tenderness.   Lt: Normal in size, without masses or tenderness.  Anus and perineum: Normal  Digital rectal exam: Not indicated  Patient informed chaperone available to be present for breast and pelvic exam. Patient has requested no chaperone to be present. Patient has been advised what will be completed during breast and pelvic exam.   Assessment/Plan:  32 y.o. G1P1001 to establish care.   Well female exam with routine gynecological exam - Education provided on SBEs, importance of preventative screenings, current guidelines, high calcium diet, regular exercise, and multivitamin daily.  Labs with PCP.   Screening for cervical cancer - Normal Pap history. Will repeat at 3-year interval per guidelines.   Return in 1 year for annual.     Olivia Mackie DNP, 7:42 AM 02/16/2023
